# Patient Record
Sex: Male | Born: 1965 | Race: White | Hispanic: No | Marital: Single | Smoking: Current every day smoker
Health system: Southern US, Community
[De-identification: ages and names within clinical notes are randomized; demographics above are authoritative.]

## PROBLEM LIST (undated history)

## (undated) DIAGNOSIS — J449 Chronic obstructive pulmonary disease, unspecified: Secondary | ICD-10-CM

## (undated) DIAGNOSIS — Z720 Tobacco use: Secondary | ICD-10-CM

## (undated) DIAGNOSIS — M1990 Unspecified osteoarthritis, unspecified site: Secondary | ICD-10-CM

## (undated) DIAGNOSIS — K219 Gastro-esophageal reflux disease without esophagitis: Secondary | ICD-10-CM

## (undated) DIAGNOSIS — M109 Gout, unspecified: Secondary | ICD-10-CM

## (undated) DIAGNOSIS — K469 Unspecified abdominal hernia without obstruction or gangrene: Secondary | ICD-10-CM

## (undated) HISTORY — DX: Unspecified osteoarthritis, unspecified site: M19.90

## (undated) HISTORY — PX: PRESSURE ULCER DEBRIDEMENT: SHX750

## (undated) HISTORY — PX: SHOULDER SURGERY: SHX246

## (undated) HISTORY — PX: CARDIOVERSION: SHX1299

## (undated) HISTORY — PX: TOE SURGERY: SHX1073

## (undated) HISTORY — PX: HERNIA REPAIR: SHX51

---

## 2014-02-03 ENCOUNTER — Emergency Department (HOSPITAL_COMMUNITY): Payer: Self-pay

## 2014-02-03 ENCOUNTER — Emergency Department (HOSPITAL_COMMUNITY)
Admission: EM | Admit: 2014-02-03 | Discharge: 2014-02-03 | Disposition: A | Discharge status: Home / Self Care | Payer: Self-pay | Attending: Emergency Medicine | Admitting: Emergency Medicine

## 2014-02-03 ENCOUNTER — Encounter (HOSPITAL_COMMUNITY): Payer: Self-pay | Admitting: Adult Health

## 2014-02-03 DIAGNOSIS — S42155A Nondisplaced fracture of neck of scapula, left shoulder, initial encounter for closed fracture: Secondary | ICD-10-CM | POA: Insufficient documentation

## 2014-02-03 DIAGNOSIS — Y9389 Activity, other specified: Secondary | ICD-10-CM | POA: Insufficient documentation

## 2014-02-03 DIAGNOSIS — S3991XA Unspecified injury of abdomen, initial encounter: Secondary | ICD-10-CM | POA: Insufficient documentation

## 2014-02-03 DIAGNOSIS — Z23 Encounter for immunization: Secondary | ICD-10-CM | POA: Insufficient documentation

## 2014-02-03 DIAGNOSIS — S0990XA Unspecified injury of head, initial encounter: Secondary | ICD-10-CM | POA: Insufficient documentation

## 2014-02-03 DIAGNOSIS — Y998 Other external cause status: Secondary | ICD-10-CM | POA: Insufficient documentation

## 2014-02-03 DIAGNOSIS — S2232XA Fracture of one rib, left side, initial encounter for closed fracture: Secondary | ICD-10-CM | POA: Insufficient documentation

## 2014-02-03 DIAGNOSIS — T1490XA Injury, unspecified, initial encounter: Secondary | ICD-10-CM

## 2014-02-03 DIAGNOSIS — Z9889 Other specified postprocedural states: Secondary | ICD-10-CM | POA: Insufficient documentation

## 2014-02-03 DIAGNOSIS — W201XXA Struck by object due to collapse of building, initial encounter: Secondary | ICD-10-CM | POA: Insufficient documentation

## 2014-02-03 DIAGNOSIS — Y9289 Other specified places as the place of occurrence of the external cause: Secondary | ICD-10-CM | POA: Insufficient documentation

## 2014-02-03 DIAGNOSIS — W19XXXA Unspecified fall, initial encounter: Secondary | ICD-10-CM

## 2014-02-03 DIAGNOSIS — Z720 Tobacco use: Secondary | ICD-10-CM | POA: Insufficient documentation

## 2014-02-03 LAB — CBC WITH DIFFERENTIAL/PLATELET
Basophils Absolute: 0 10*3/uL (ref 0.0–0.1)
Basophils Relative: 0 % (ref 0–1)
EOS PCT: 2 % (ref 0–5)
Eosinophils Absolute: 0.2 10*3/uL (ref 0.0–0.7)
HEMATOCRIT: 43.1 % (ref 39.0–52.0)
Hemoglobin: 14.4 g/dL (ref 13.0–17.0)
LYMPHS ABS: 1.8 10*3/uL (ref 0.7–4.0)
Lymphocytes Relative: 14 % (ref 12–46)
MCH: 30.7 pg (ref 26.0–34.0)
MCHC: 33.4 g/dL (ref 30.0–36.0)
MCV: 91.9 fL (ref 78.0–100.0)
MONOS PCT: 7 % (ref 3–12)
Monocytes Absolute: 0.9 10*3/uL (ref 0.1–1.0)
Neutro Abs: 10.1 10*3/uL — ABNORMAL HIGH (ref 1.7–7.7)
Neutrophils Relative %: 77 % (ref 43–77)
Platelets: 349 10*3/uL (ref 150–400)
RBC: 4.69 MIL/uL (ref 4.22–5.81)
RDW: 13.5 % (ref 11.5–15.5)
WBC: 13 10*3/uL — ABNORMAL HIGH (ref 4.0–10.5)

## 2014-02-03 LAB — COMPREHENSIVE METABOLIC PANEL
ALT: 15 U/L (ref 0–53)
AST: 27 U/L (ref 0–37)
Albumin: 3.8 g/dL (ref 3.5–5.2)
Alkaline Phosphatase: 92 U/L (ref 39–117)
Anion gap: 16 — ABNORMAL HIGH (ref 5–15)
BUN: 16 mg/dL (ref 6–23)
CALCIUM: 9.3 mg/dL (ref 8.4–10.5)
CO2: 22 meq/L (ref 19–32)
Chloride: 104 mEq/L (ref 96–112)
Creatinine, Ser: 1.25 mg/dL (ref 0.50–1.35)
GFR, EST AFRICAN AMERICAN: 78 mL/min — AB (ref >90)
GFR, EST NON AFRICAN AMERICAN: 67 mL/min — AB (ref >90)
GLUCOSE: 96 mg/dL (ref 70–99)
Potassium: 4.8 mEq/L (ref 3.7–5.3)
Sodium: 142 mEq/L (ref 137–147)
Total Bilirubin: 0.3 mg/dL (ref 0.3–1.2)
Total Protein: 7 g/dL (ref 6.0–8.3)

## 2014-02-03 LAB — I-STAT CHEM 8, ED
BUN: 19 mg/dL (ref 6–23)
Calcium, Ion: 1.14 mmol/L (ref 1.12–1.23)
Chloride: 104 mEq/L (ref 96–112)
Creatinine, Ser: 1.4 mg/dL — ABNORMAL HIGH (ref 0.50–1.35)
GLUCOSE: 95 mg/dL (ref 70–99)
HEMATOCRIT: 46 % (ref 39.0–52.0)
HEMOGLOBIN: 15.6 g/dL (ref 13.0–17.0)
Potassium: 4.5 mEq/L (ref 3.7–5.3)
SODIUM: 141 meq/L (ref 137–147)
TCO2: 26 mmol/L (ref 0–100)

## 2014-02-03 MED ORDER — HYDROMORPHONE HCL 1 MG/ML IJ SOLN
1.0000 mg | Freq: Once | INTRAMUSCULAR | Status: AC
Start: 2014-02-03 — End: 2014-02-03
  Administered 2014-02-03: 1 mg via INTRAVENOUS
  Filled 2014-02-03: qty 1

## 2014-02-03 MED ORDER — SODIUM CHLORIDE 0.9 % IV BOLUS (SEPSIS)
1000.0000 mL | Freq: Once | INTRAVENOUS | Status: AC
Start: 2014-02-03 — End: 2014-02-03
  Administered 2014-02-03: 1000 mL via INTRAVENOUS

## 2014-02-03 MED ORDER — TETANUS-DIPHTH-ACELL PERTUSSIS 5-2.5-18.5 LF-MCG/0.5 IM SUSP
0.5000 mL | Freq: Once | INTRAMUSCULAR | Status: AC
  Administered 2014-02-03: 0.5 mL via INTRAMUSCULAR
  Filled 2014-02-03: qty 0.5

## 2014-02-03 MED ORDER — HYDROCODONE-ACETAMINOPHEN 5-325 MG PO TABS: 1.0000 | ORAL_TABLET | Freq: Four times a day (QID) | ORAL | Status: DC | PRN

## 2014-02-03 MED ORDER — IOHEXOL 300 MG/ML  SOLN
100.0000 mL | Freq: Once | INTRAMUSCULAR | Status: AC | PRN
  Administered 2014-02-03: 90 mL via INTRAVENOUS

## 2014-02-03 NOTE — ED Notes (Signed)
After discharge, pt requesting pain meds & was advised to fill prescription for Norco/Vicodin.

## 2014-02-03 NOTE — ED Notes (Signed)
PResents post fall from Scaffold approximately 10-15 feet high and then a garage fell on top pf patient, scrapes and redness to left plank and scrape to upper right back. Endorses neck pain, SOB and generalized body pain. Unknown tetanus GCS 15

## 2014-02-03 NOTE — Discharge Instructions (Signed)
Follow up with dr. Lajoyce Cornersduda next week.

## 2014-02-03 NOTE — ED Provider Notes (Signed)
CSN: [id number]     Arrival date & time 02/03/14  1553 History   First MD Initiated Contact with Patient 02/03/14 1608     Chief Complaint  Patient presents with  . Fall     (Consider location/radiation/quality/duration/timing/severity/associated sxs/prior Treatment) Patient is a 48 y.o. male presenting with fall. The history is provided by the patient (the pt fell about 15 feet and a garage fell on him.  pt has back pain).  Fall This is a new problem. The current episode started 1 to 2 hours ago. The problem occurs constantly. The problem has not changed since onset.Associated symptoms include abdominal pain and headaches. Pertinent negatives include no chest pain. Exacerbated by: movement. Nothing relieves the symptoms. He has tried nothing for the symptoms.    History reviewed. No pertinent past medical history. Past Surgical History  Procedure Laterality Date  . Hernia repair    . Shoulder surgery     History reviewed. No pertinent family history. History  Substance Use Topics  . Smoking status: Current Every Day Smoker    Types: Cigarettes  . Smokeless tobacco: Not on file  . Alcohol Use: No    Review of Systems  Constitutional: Negative for appetite change and fatigue.  HENT: Negative for congestion, ear discharge and sinus pressure.   Eyes: Negative for discharge.  Respiratory: Negative for cough.   Cardiovascular: Negative for chest pain.  Gastrointestinal: Positive for abdominal pain. Negative for diarrhea.  Genitourinary: Negative for frequency and hematuria.  Musculoskeletal: Positive for back pain.  Skin: Negative for rash.  Neurological: Positive for headaches. Negative for seizures.  Psychiatric/Behavioral: Negative for hallucinations.      Allergies  Review of patient's allergies indicates no known allergies.  Home Medications   Prior to Admission medications   Medication Sig Start Date End Date Taking? Authorizing Provider   HYDROcodone-acetaminophen (NORCO/VICODIN) 5-325 MG per tablet Take 1 tablet by mouth every 6 (six) hours as needed for moderate pain. 02/03/14   Benny Lennert, MD   BP 131/80 mmHg  Pulse 91  Temp(Src) 98.8 F (37.1 C) (Oral)  Resp 24  Ht 6' 4.5" (1.943 m)  Wt 185 lb (83.915 kg)  BMI 22.23 kg/m2  SpO2 98% Physical Exam  Constitutional: He is oriented to person, place, and time. He appears well-developed.  HENT:  Head: Normocephalic.  Eyes: Conjunctivae and EOM are normal. No scleral icterus.  Neck: Neck supple. No thyromegaly present.  Cardiovascular: Normal rate and regular rhythm.  Exam reveals no gallop and no friction rub.   No murmur heard. Pulmonary/Chest: No stridor. He has no wheezes. He has no rales. He exhibits tenderness.  Abdominal: He exhibits no distension. There is no tenderness. There is no rebound.  Musculoskeletal: Normal range of motion. He exhibits no edema.  Tender left scapula and lower back  Lymphadenopathy:    He has no cervical adenopathy.  Neurological: He is oriented to person, place, and time. He exhibits normal muscle tone. Coordination normal.  Skin: No rash noted. No erythema.  Psychiatric: He has a normal mood and affect. His behavior is normal.    ED Course  Procedures (including critical care time) Labs Review Labs Reviewed  CBC WITH DIFFERENTIAL - Abnormal; Notable for the following:    WBC 13.0 (*)    Neutro Abs 10.1 (*)    All other components within normal limits  COMPREHENSIVE METABOLIC PANEL - Abnormal; Notable for the following:    GFR calc non Af Amer 67 (*)  GFR calc Af Amer 78 (*)    Anion gap 16 (*)    All other components within normal limits  I-STAT CHEM 8, ED - Abnormal; Notable for the following:    Creatinine, Ser 1.40 (*)    All other components within normal limits    Imaging Review Ct Head Wo Contrast  02/03/2014   CLINICAL DATA:  Larey SeatFell from a scaffold approximately 10-15 feet. Material then fell on top of the  patient. Neck pain. Headache.  EXAM: CT HEAD WITHOUT CONTRAST  CT CERVICAL SPINE WITHOUT CONTRAST  TECHNIQUE: Multidetector CT imaging of the head and cervical spine was performed following the standard protocol without intravenous contrast. Multiplanar CT image reconstructions of the cervical spine were also generated.  COMPARISON:  None.  FINDINGS: CT HEAD FINDINGS  The brain has a normal appearance without evidence of atrophy, infarction, mass lesion, hemorrhage, hydrocephalus or extra-axial collection. The calvarium is unremarkable. The paranasal sinuses, middle ears and mastoids are clear.  CT CERVICAL SPINE FINDINGS  Alignment is normal. 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 C2-3 is normal. C3-4 shows facet degeneration left more than right but no significant stenosis. C4-5 shows facet degeneration and and uncovertebral degeneration bilaterally with moderate foraminal narrowing. C5-6 shows facet degeneration and spondylosis left worse than right with left foraminal stenosis. 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 C7-T1 shows facet arthropathy left worse than right with some foraminal narrowing on the left. T1-2 shows congenital failure of separation. Lung apices show emphysema.  IMPRESSION: Head CT:  Normal.  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 Degenerative changes of the neck as outlined above. No acute or traumatic finding.   Electronically Signed   By: Paulina FusiMark  Shogry M.D.   On: 02/03/2014 18:19   Ct Chest W Contrast  02/03/2014   CLINICAL DATA:  Post fall from scaffold about 10-15 feet high right upper back pain  EXAM: CT CHEST, ABDOMEN, AND PELVIS WITH CONTRAST  TECHNIQUE: Multidetector CT imaging of the chest, abdomen and pelvis was performed following the standard protocol during bolus administration of intravenous contrast.  CONTRAST:  90mL OMNIPAQUE IOHEXOL 300 MG/ML  SOLN  COMPARISON:  None.  FINDINGS: CT CHEST FINDINGS  Images of the thoracic  inlet are unremarkable. Sagittal images of the spine shows degenerative changes mid thoracic spine. Sagittal view of the sternum is unremarkable.  There are emphysematous changes with paraseptal and centrilobular emphysematous bullae bilateral upper lobes. Axial image 8 there is nondisplaced fracture of posterior medial aspect of left scapula. Axial image 29 there is nondisplaced fracture of posterior aspect left seventh rib. Small amount of adjacent pleural thickening and probable lung contusion. There is also old fracture of the lateral aspect of the left seventh rib. Small amount of atelectasis or infiltrate lung bases posteriorly. There is no diagnostic pneumothorax.  Heart size within normal limits. Thoracic aorta is unremarkable. No mediastinal hematoma or adenopathy. Central pulmonary artery is unremarkable. No pleural effusion.  CT ABDOMEN AND PELVIS FINDINGS  Enhanced liver shows no evidence of laceration. No lower rib fractures are identified. Sagittal images of the lumbar spine shows no acute fractures. Mild degenerative changes lumbar spine.  No sacral fracture is noted. Mild degenerative changes bilateral SI joints. No pelvic fractures are noted. The pancreas, spleen and adrenal glands are unremarkable. No evidence of splenic laceration. Kidneys are symmetrical in size and enhancement. No hydronephrosis or hydroureter. Abdominal aorta is unremarkable.  No thickened or dilated small bowel loops are noted.  Normal appendix seen in axial image 108. No pericecal inflammation. Moderate colonic stool. No pelvic ascites or adenopathy. There is a tiny ventral hernia in left inguinal region containing fat measures about 1.9 cm. No evidence of acute complication. No evidence of urinary bladder injury. Prostate gland and seminal vesicles are unremarkable.  Coronal images shows no hip fractures.  IMPRESSION: 1. There is nondisplaced fracture of posteromedial aspect of the left scapula. 2. Nondisplaced fracture of  the left posterior seventh rib with adjacent small pleural thickening and probable tiny lung contusion. Small atelectasis or infiltrate lung bases posteriorly. 3. Emphysematous changes bilateral upper lobes. No pneumothorax. No segmental infiltrate or pulmonary edema. 4. No mediastinal hematoma or adenopathy. Thoracic and abdominal aorta are unremarkable. 5. No acute visceral injury within abdomen or pelvis. 6. No hydronephrosis or hydroureter. 7. No evidence of urinary bladder injury. 8. No acute fractures are noted within abdomen or pelvis.   Electronically Signed   By: Natasha Mead M.D.   On: 02/03/2014 18:32   Ct Cervical Spine Wo Contrast  02/03/2014   CLINICAL DATA:  Larey Seat from a scaffold approximately 10-15 feet. Material then fell on top of the patient. Neck pain. Headache.  EXAM: CT HEAD WITHOUT CONTRAST  CT CERVICAL SPINE WITHOUT CONTRAST  TECHNIQUE: Multidetector CT imaging of the head and cervical spine was performed following the standard protocol without intravenous contrast. Multiplanar CT image reconstructions of the cervical spine were also generated.  COMPARISON:  None.  FINDINGS: CT HEAD FINDINGS  The brain has a normal appearance without evidence of atrophy, infarction, mass lesion, hemorrhage, hydrocephalus or extra-axial collection. The calvarium is unremarkable. The paranasal sinuses, middle ears and mastoids are clear.  CT CERVICAL SPINE FINDINGS  Alignment is normal. 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 C2-3 is normal. C3-4 shows facet degeneration left more than right but no significant stenosis. C4-5 shows facet degeneration and and uncovertebral degeneration bilaterally with moderate foraminal narrowing. C5-6 shows facet degeneration and spondylosis left worse than right with left foraminal stenosis. 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 C7-T1 shows facet arthropathy left worse than right with some foraminal narrowing on the left. T1-2 shows congenital failure  of separation. Lung apices show emphysema.  IMPRESSION: Head CT:  Normal.  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 Degenerative changes of the neck as outlined above. No acute or traumatic finding.   Electronically Signed   By: Paulina Fusi M.D.   On: 02/03/2014 18:19   Ct Abdomen Pelvis W Contrast  02/03/2014   CLINICAL DATA:  Post fall from scaffold about 10-15 feet high right upper back pain  EXAM: CT CHEST, ABDOMEN, AND PELVIS WITH CONTRAST  TECHNIQUE: Multidetector CT imaging of the chest, abdomen and pelvis was performed following the standard protocol during bolus administration of intravenous contrast.  CONTRAST:  90mL OMNIPAQUE IOHEXOL 300 MG/ML  SOLN  COMPARISON:  None.  FINDINGS: CT CHEST FINDINGS  Images of the thoracic inlet are unremarkable. Sagittal images of the spine shows degenerative changes mid thoracic spine. Sagittal view of the sternum is unremarkable.  There are emphysematous changes with paraseptal and centrilobular emphysematous bullae bilateral upper lobes. Axial image 8 there is nondisplaced fracture of posterior medial aspect of left scapula. Axial image 29 there is nondisplaced fracture of posterior aspect left seventh rib. Small amount of adjacent pleural thickening and probable lung contusion. There is also old fracture of the lateral aspect of the left seventh rib. Small amount of atelectasis or  infiltrate lung bases posteriorly. There is no diagnostic pneumothorax.  Heart size within normal limits. Thoracic aorta is unremarkable. No mediastinal hematoma or adenopathy. Central pulmonary artery is unremarkable. No pleural effusion.  CT ABDOMEN AND PELVIS FINDINGS  Enhanced liver shows no evidence of laceration. No lower rib fractures are identified. Sagittal images of the lumbar spine shows no acute fractures. Mild degenerative changes lumbar spine.  No sacral fracture is noted. Mild degenerative changes bilateral SI joints. No pelvic fractures are noted.  The pancreas, spleen and adrenal glands are unremarkable. No evidence of splenic laceration. Kidneys are symmetrical in size and enhancement. No hydronephrosis or hydroureter. Abdominal aorta is unremarkable.  No thickened or dilated small bowel loops are noted. Normal appendix seen in axial image 108. No pericecal inflammation. Moderate colonic stool. No pelvic ascites or adenopathy. There is a tiny ventral hernia in left inguinal region containing fat measures about 1.9 cm. No evidence of acute complication. No evidence of urinary bladder injury. Prostate gland and seminal vesicles are unremarkable.  Coronal images shows no hip fractures.  IMPRESSION: 1. There is nondisplaced fracture of posteromedial aspect of the left scapula. 2. Nondisplaced fracture of the left posterior seventh rib with adjacent small pleural thickening and probable tiny lung contusion. Small atelectasis or infiltrate lung bases posteriorly. 3. Emphysematous changes bilateral upper lobes. No pneumothorax. No segmental infiltrate or pulmonary edema. 4. No mediastinal hematoma or adenopathy. Thoracic and abdominal aorta are unremarkable. 5. No acute visceral injury within abdomen or pelvis. 6. No hydronephrosis or hydroureter. 7. No evidence of urinary bladder injury. 8. No acute fractures are noted within abdomen or pelvis.   Electronically Signed   By: Natasha MeadLiviu  Pop M.D.   On: 02/03/2014 18:32   Dg Chest Port 1 View  02/03/2014   CLINICAL DATA:  Pt states 2 walls of a building fell on him, pains bilat ribs, hx old fx from years aho, left shoulder pains, posterior upper scapular abrasion, but has full range of motion, smoker 1ppd, hx copd, htn  EXAM: PORTABLE CHEST - 1 VIEW  COMPARISON:  None  FINDINGS: Cardiac silhouette is normal in size. Normal mediastinal and hilar contours.  Lungs are hyperexpanded. There are reticular opacities in the lung bases, likely scarring and/ or atelectasis. Minor scarring is noted in the apices. No lung  consolidation or edema. No pleural effusion or pneumothorax.  Old healed left-sided rib fractures.  No acute fractures seen.  IMPRESSION: No acute cardiopulmonary disease.   Electronically Signed   By: Amie Portlandavid  Ormond M.D.   On: 02/03/2014 16:54     EKG Interpretation   Date/Time:  Tuesday February 03 2014 16:32:56 EST Ventricular Rate:  86 PR Interval:  134 QRS Duration: 101 QT Interval:  369 QTC Calculation: 441 R Axis:   -131 Text Interpretation:  Right and left arm electrode reversal,  interpretation assumes no reversal Sinus rhythm Right axis deviation  Abnormal T, consider ischemia, lateral leads ST elev, probable normal  early repol pattern Confirmed by Christphor Groft  MD, Iker Nuttall [phone]([id number]) on 02/03/2014  7:00:05 PM      MDM   Final diagnoses:  Trauma  Fall  Closed rib fracture, left, initial encounter    Pt will follow up with ortho for scapula and rib fx.   tx with vicodin   Benny LennertJoseph L Wilmore Holsomback, MD 02/03/14 1900

## 2015-09-26 ENCOUNTER — Inpatient Hospital Stay (HOSPITAL_COMMUNITY)
Admission: EM | Admit: 2015-09-26 | Discharge: 2015-10-03 | DRG: 854 | Disposition: A | Discharge status: Home / Self Care | Payer: Commercial Managed Care - PPO | Attending: Internal Medicine | Admitting: Internal Medicine

## 2015-09-26 ENCOUNTER — Encounter (HOSPITAL_COMMUNITY): Payer: Self-pay | Admitting: Emergency Medicine

## 2015-09-26 DIAGNOSIS — L03115 Cellulitis of right lower limb: Secondary | ICD-10-CM | POA: Diagnosis present

## 2015-09-26 DIAGNOSIS — L02415 Cutaneous abscess of right lower limb: Secondary | ICD-10-CM | POA: Diagnosis present

## 2015-09-26 DIAGNOSIS — Z720 Tobacco use: Secondary | ICD-10-CM | POA: Diagnosis present

## 2015-09-26 DIAGNOSIS — K5900 Constipation, unspecified: Secondary | ICD-10-CM | POA: Diagnosis not present

## 2015-09-26 DIAGNOSIS — Z23 Encounter for immunization: Secondary | ICD-10-CM

## 2015-09-26 DIAGNOSIS — M109 Gout, unspecified: Secondary | ICD-10-CM | POA: Diagnosis present

## 2015-09-26 DIAGNOSIS — J449 Chronic obstructive pulmonary disease, unspecified: Secondary | ICD-10-CM | POA: Diagnosis present

## 2015-09-26 DIAGNOSIS — F17210 Nicotine dependence, cigarettes, uncomplicated: Secondary | ICD-10-CM | POA: Diagnosis present

## 2015-09-26 DIAGNOSIS — A419 Sepsis, unspecified organism: Secondary | ICD-10-CM | POA: Diagnosis present

## 2015-09-26 DIAGNOSIS — B999 Unspecified infectious disease: Secondary | ICD-10-CM

## 2015-09-26 DIAGNOSIS — M25571 Pain in right ankle and joints of right foot: Secondary | ICD-10-CM | POA: Diagnosis not present

## 2015-09-26 HISTORY — DX: Unspecified abdominal hernia without obstruction or gangrene: K46.9

## 2015-09-26 HISTORY — DX: Gout, unspecified: M10.9

## 2015-09-26 HISTORY — DX: Tobacco use: Z72.0

## 2015-09-26 HISTORY — DX: Chronic obstructive pulmonary disease, unspecified: J44.9

## 2015-09-26 NOTE — ED Notes (Signed)
Pt states on Thursday he stepped off the back of a truck and felt a pop in the top of his right foot   Pt states since he has had pain  Pt states today the foot and ankle area are red and swollen  Pt has redness and swelling from his foot up to above his ankle  Pt states it is a burning pain that is worse when he puts pressure on it  Pt states he has had fever and chills as well  Pt last took ibuprofen at 1100 this afternoon

## 2015-09-26 NOTE — ED Provider Notes (Signed)
CSN: [id number]     Arrival date & time 09/26/15  2314 History   First MD Initiated Contact with Patient 09/26/15 2340     Chief Complaint  Patient presents with  . Foot Injury     (Consider location/radiation/quality/duration/timing/severity/associated sxs/prior Treatment) HPI Comments: Patient with past medical history remarkable for gout presents to the emergency department with chief complaint of right ankle pain and swelling anteriorly. He reports redness that has been spreading from his ankle to his shin. He states that he stepped off of a truck on Thursday, and felt a pop in his foot. He reports having pain since then. He states that the redness has been swelling. He reports associated fevers and chills. Denies any nausea or vomiting. He has tried using ice and ibuprofen with no relief. His symptoms are worsened with palpation.  The history is provided by the patient. No language interpreter was used.    Past Medical History  Diagnosis Date  . Hernia, abdominal   . COPD (chronic obstructive pulmonary disease) (HCC)   . Gout    Past Surgical History  Procedure Laterality Date  . Hernia repair    . Shoulder surgery     Family History  Problem Relation Age of Onset  . Cancer Mother   . Parkinson's disease Mother   . Emphysema Father    Social History  Substance Use Topics  . Smoking status: Current Every Day Smoker    Types: Cigarettes  . Smokeless tobacco: None  . Alcohol Use: No    Review of Systems  Skin: Positive for color change.  All other systems reviewed and are negative.     Allergies  Review of patient's allergies indicates no known allergies.  Home Medications   Prior to Admission medications   Medication Sig Start Date End Date Taking? Authorizing Provider  ibuprofen (ADVIL,MOTRIN) 200 MG tablet Take 600 mg by mouth every 6 (six) hours as needed (FOR PAIN.).   Yes Historical Provider, MD  Multiple Vitamin (MULTIVITAMIN WITH MINERALS) TABS tablet  Take 1 tablet by mouth daily. FOR MEN.   Yes Historical Provider, MD   BP 112/80 mmHg  Pulse 104  Temp(Src) 99.6 F (37.6 C) (Oral)  Resp 20  Ht 6' 4.5" (1.943 m)  Wt 83.915 kg  BMI 22.23 kg/m2  SpO2 98% Physical Exam  Constitutional: He is oriented to person, place, and time. He appears well-developed and well-nourished.  HENT:  Head: Normocephalic and atraumatic.  Eyes: Conjunctivae and EOM are normal. Pupils are equal, round, and reactive to light. Right eye exhibits no discharge. Left eye exhibits no discharge. No scleral icterus.  Neck: Normal range of motion. Neck supple. No JVD present.  Cardiovascular: Regular rhythm, normal heart sounds and intact distal pulses.  Exam reveals no gallop and no friction rub.   No murmur heard. Mild tachycardic  Pulmonary/Chest: Effort normal and breath sounds normal. No respiratory distress. He has no wheezes. He has no rales. He exhibits no tenderness.  Abdominal: Soft. He exhibits no distension and no mass. There is no tenderness. There is no rebound and no guarding.  Musculoskeletal: Normal range of motion. He exhibits no edema or tenderness.  ROM and strength of right ankle is 5/5, but painful, doubt septic joint Mild swelling of ankle  Neurological: He is alert and oriented to person, place, and time.  Sensation intact  Skin: Skin is warm and dry.  Erythema consistent with cellulitis, no obvious abscess  Psychiatric: He has a normal mood and  affect. His behavior is normal. Judgment and thought content normal.  Nursing note and vitals reviewed.   ED Course  Procedures (including critical care time) Results for orders placed or performed during the hospital encounter of 09/26/15  CBC with Differential/Platelet  Result Value Ref Range   WBC 17.2 (H) 4.0 - 10.5 K/uL   RBC 4.53 4.22 - 5.81 MIL/uL   Hemoglobin 14.2 13.0 - 17.0 g/dL   HCT 96.0 45.4 - 09.8 %   MCV 88.5 78.0 - 100.0 fL   MCH 31.3 26.0 - 34.0 pg   MCHC 35.4 30.0 - 36.0  g/dL   RDW 11.9 14.7 - 82.9 %   Platelets 296 150 - 400 K/uL   Neutrophils Relative % 77 %   Neutro Abs 13.2 (H) 1.7 - 7.7 K/uL   Lymphocytes Relative 12 %   Lymphs Abs 2.0 0.7 - 4.0 K/uL   Monocytes Relative 11 %   Monocytes Absolute 1.9 (H) 0.1 - 1.0 K/uL   Eosinophils Relative 0 %   Eosinophils Absolute 0.1 0.0 - 0.7 K/uL   Basophils Relative 0 %   Basophils Absolute 0.0 0.0 - 0.1 K/uL  I-stat chem 8, ed  Result Value Ref Range   Sodium 138 135 - 145 mmol/L   Potassium 3.8 3.5 - 5.1 mmol/L   Chloride 100 (L) 101 - 111 mmol/L   BUN 14 6 - 20 mg/dL   Creatinine, Ser 5.62 0.61 - 1.24 mg/dL   Glucose, Bld 130 (H) 65 - 99 mg/dL   Calcium, Ion 8.65 7.84 - 1.30 mmol/L   TCO2 25 0 - 100 mmol/L   Hemoglobin 15.0 13.0 - 17.0 g/dL   HCT 69.6 29.5 - 28.4 %   Dg Ankle Complete Right  09/27/2015  CLINICAL DATA:  Right foot and ankle pain. Stepped off back of a truck 3 days prior, felt a pop. Pain since that time. Redness and swelling today. EXAM: RIGHT ANKLE - COMPLETE 3+ VIEW COMPARISON:  None. FINDINGS: No fracture or dislocation. The alignment and joint spaces are maintained. Ankle mortise is preserved. No bony destructive change or periosteal reaction. There is soft tissue edema noted. No evidence joint effusion. IMPRESSION: Soft tissue edema.  No acute osseous abnormality. Electronically Signed   By: Rubye Oaks M.D.   On: 09/27/2015 00:38   Dg Foot Complete Right  09/27/2015  CLINICAL DATA:  Right foot and ankle pain. Stepped off back of a truck 3 days prior, felt a pop. Pain since that time. Redness and swelling today. EXAM: RIGHT FOOT COMPLETE - 3+ VIEW COMPARISON:  None. FINDINGS: No acute fracture or subluxation. Degenerative change at the first metatarsal phalangeal joint with joint space narrowing and subchondral change. There is a prominent os navicular. No bony destructive change or periosteal reaction. Questionable pes planus, these are nonweightbearing views. Dorsal soft tissue  edema. IMPRESSION: Dorsal soft tissue edema.  No acute osseous abnormalities. 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 Electronically Signed   By: Rubye Oaks M.D.   On: 09/27/2015 00:40    I have personally reviewed and evaluated these images and lab results as part of my medical decision-making.    MDM   Final diagnoses:  Cellulitis of right lower extremity    Patient with cellulitis of the right anterior ankle.  Doubt septic joint, normal ROM and strength.  No evidence of abscess on [place].  Patient is not diabetic or immune compromised.    Patient seen by and discussed with  Dr. Lynelle DoctorKnapp, who agrees with outpatient trial unless concerning lab findings.  Recommends a dose of IV antibiotics here now.  Patient understands and agrees with the plan.  12:37 AM WBC is 17.2 with left shift.  Discussed with Dr. Lynelle DoctorKnapp, who recommends observation in the hospital.  12:51 AM Appreciate Dr. Clyde LundborgNiu for observing the patient in the hospital overnight.    Roxy Horsemanobert Dhriti Fales, PA-C 09/27/15 [id number]  Linwood DibblesJon Knapp, MD 09/28/15 [phone]

## 2015-09-27 ENCOUNTER — Encounter (HOSPITAL_COMMUNITY): Payer: Self-pay | Admitting: Internal Medicine

## 2015-09-27 ENCOUNTER — Observation Stay (HOSPITAL_COMMUNITY): Payer: Commercial Managed Care - PPO

## 2015-09-27 ENCOUNTER — Emergency Department (HOSPITAL_COMMUNITY): Payer: Commercial Managed Care - PPO

## 2015-09-27 DIAGNOSIS — L03115 Cellulitis of right lower limb: Secondary | ICD-10-CM | POA: Diagnosis present

## 2015-09-27 DIAGNOSIS — J449 Chronic obstructive pulmonary disease, unspecified: Secondary | ICD-10-CM | POA: Diagnosis present

## 2015-09-27 DIAGNOSIS — A419 Sepsis, unspecified organism: Principal | ICD-10-CM

## 2015-09-27 DIAGNOSIS — Z720 Tobacco use: Secondary | ICD-10-CM

## 2015-09-27 HISTORY — DX: Sepsis, unspecified organism: A41.9

## 2015-09-27 HISTORY — DX: Cellulitis of right lower limb: L03.115

## 2015-09-27 LAB — BASIC METABOLIC PANEL
Anion gap: 6 (ref 5–15)
BUN: 12 mg/dL (ref 6–20)
CALCIUM: 7.8 mg/dL — AB (ref 8.9–10.3)
CHLORIDE: 107 mmol/L (ref 101–111)
CO2: 24 mmol/L (ref 22–32)
CREATININE: 0.9 mg/dL (ref 0.61–1.24)
GFR calc Af Amer: >60 mL/min (ref >60)
GFR calc non Af Amer: >60 mL/min (ref >60)
Glucose, Bld: 150 mg/dL — ABNORMAL HIGH (ref 65–99)
Potassium: 3.2 mmol/L — ABNORMAL LOW (ref 3.5–5.1)
Sodium: 137 mmol/L (ref 135–145)

## 2015-09-27 LAB — SEDIMENTATION RATE: Sed Rate: 24 mm/hr — ABNORMAL HIGH (ref 0–16)

## 2015-09-27 LAB — I-STAT CHEM 8, ED
BUN: 14 mg/dL (ref 6–20)
CALCIUM ION: 1.13 mmol/L (ref 1.13–1.30)
Chloride: 100 mmol/L — ABNORMAL LOW (ref 101–111)
Creatinine, Ser: 1 mg/dL (ref 0.61–1.24)
GLUCOSE: 105 mg/dL — AB (ref 65–99)
HCT: 44 % (ref 39.0–52.0)
Hemoglobin: 15 g/dL (ref 13.0–17.0)
Potassium: 3.8 mmol/L (ref 3.5–5.1)
Sodium: 138 mmol/L (ref 135–145)
TCO2: 25 mmol/L (ref 0–100)

## 2015-09-27 LAB — C-REACTIVE PROTEIN: CRP: 14.3 mg/dL — ABNORMAL HIGH (ref <1.0)

## 2015-09-27 LAB — CBC WITH DIFFERENTIAL/PLATELET
Basophils Absolute: 0 10*3/uL (ref 0.0–0.1)
Basophils Relative: 0 %
EOS PCT: 0 %
Eosinophils Absolute: 0.1 10*3/uL (ref 0.0–0.7)
HCT: 40.1 % (ref 39.0–52.0)
Hemoglobin: 14.2 g/dL (ref 13.0–17.0)
LYMPHS ABS: 2 10*3/uL (ref 0.7–4.0)
LYMPHS PCT: 12 %
MCH: 31.3 pg (ref 26.0–34.0)
MCHC: 35.4 g/dL (ref 30.0–36.0)
MCV: 88.5 fL (ref 78.0–100.0)
MONOS PCT: 11 %
Monocytes Absolute: 1.9 10*3/uL — ABNORMAL HIGH (ref 0.1–1.0)
Neutro Abs: 13.2 10*3/uL — ABNORMAL HIGH (ref 1.7–7.7)
Neutrophils Relative %: 77 %
PLATELETS: 296 10*3/uL (ref 150–400)
RBC: 4.53 MIL/uL (ref 4.22–5.81)
RDW: 13.1 % (ref 11.5–15.5)
WBC: 17.2 10*3/uL — ABNORMAL HIGH (ref 4.0–10.5)

## 2015-09-27 LAB — CBC
HCT: 35.5 % — ABNORMAL LOW (ref 39.0–52.0)
Hemoglobin: 12 g/dL — ABNORMAL LOW (ref 13.0–17.0)
MCH: 30.8 pg (ref 26.0–34.0)
MCHC: 33.8 g/dL (ref 30.0–36.0)
MCV: 91 fL (ref 78.0–100.0)
PLATELETS: 258 10*3/uL (ref 150–400)
RBC: 3.9 MIL/uL — ABNORMAL LOW (ref 4.22–5.81)
RDW: 13.5 % (ref 11.5–15.5)
WBC: 13.7 10*3/uL — ABNORMAL HIGH (ref 4.0–10.5)

## 2015-09-27 LAB — PROTIME-INR
INR: 1.1 (ref 0.00–1.49)
Prothrombin Time: 14 seconds (ref 11.6–15.2)

## 2015-09-27 LAB — APTT: aPTT: 28 seconds (ref 24–37)

## 2015-09-27 LAB — HIV ANTIBODY (ROUTINE TESTING W REFLEX): HIV SCREEN 4TH GENERATION: NONREACTIVE

## 2015-09-27 LAB — PROCALCITONIN

## 2015-09-27 LAB — LACTIC ACID, PLASMA
Lactic Acid, Venous: 0.9 mmol/L (ref 0.5–1.9)
Lactic Acid, Venous: 1.3 mmol/L (ref 0.5–1.9)

## 2015-09-27 MED ORDER — VANCOMYCIN HCL IN DEXTROSE 1-5 GM/200ML-% IV SOLN: 1000.0000 mg | Freq: Once | INTRAVENOUS | Status: DC

## 2015-09-27 MED ORDER — DOXYCYCLINE HYCLATE 100 MG PO CAPS: 100.0000 mg | ORAL_CAPSULE | Freq: Two times a day (BID) | ORAL | Status: DC

## 2015-09-27 MED ORDER — SODIUM CHLORIDE 0.9% FLUSH
3.0000 mL | Freq: Two times a day (BID) | INTRAVENOUS | Status: DC
  Administered 2015-09-28 – 2015-10-03 (×5): 3 mL via INTRAVENOUS

## 2015-09-27 MED ORDER — ACETAMINOPHEN 650 MG RE SUPP: 650.0000 mg | Freq: Four times a day (QID) | RECTAL | Status: DC | PRN

## 2015-09-27 MED ORDER — OXYCODONE-ACETAMINOPHEN 5-325 MG PO TABS
2.0000 | ORAL_TABLET | Freq: Four times a day (QID) | ORAL | Status: DC | PRN
  Administered 2015-09-27 – 2015-10-01 (×16): 2 via ORAL
  Filled 2015-09-27 (×17): qty 2

## 2015-09-27 MED ORDER — NICOTINE 21 MG/24HR TD PT24
21.0000 mg | MEDICATED_PATCH | Freq: Every day | TRANSDERMAL | Status: DC
  Administered 2015-09-27 – 2015-10-03 (×7): 21 mg via TRANSDERMAL
  Filled 2015-09-27 (×7): qty 1

## 2015-09-27 MED ORDER — SODIUM CHLORIDE 0.9 % IV SOLN
INTRAVENOUS | Status: AC
  Administered 2015-09-27: 19:00:00 via INTRAVENOUS

## 2015-09-27 MED ORDER — POTASSIUM CHLORIDE CRYS ER 20 MEQ PO TBCR
40.0000 meq | EXTENDED_RELEASE_TABLET | Freq: Four times a day (QID) | ORAL | Status: AC
  Administered 2015-09-27 (×2): 40 meq via ORAL
  Filled 2015-09-27 (×2): qty 2

## 2015-09-27 MED ORDER — ONDANSETRON HCL 4 MG/2ML IJ SOLN: 4.0000 mg | Freq: Three times a day (TID) | INTRAMUSCULAR | Status: DC | PRN

## 2015-09-27 MED ORDER — PNEUMOCOCCAL VAC POLYVALENT 25 MCG/0.5ML IJ INJ
0.5000 mL | INJECTION | INTRAMUSCULAR | Status: AC
  Administered 2015-09-28: 0.5 mL via INTRAMUSCULAR
  Filled 2015-09-27 (×2): qty 0.5

## 2015-09-27 MED ORDER — VANCOMYCIN HCL IN DEXTROSE 1-5 GM/200ML-% IV SOLN
1000.0000 mg | Freq: Two times a day (BID) | INTRAVENOUS | Status: DC
  Administered 2015-09-27 – 2015-09-30 (×7): 1000 mg via INTRAVENOUS
  Filled 2015-09-27 (×7): qty 200

## 2015-09-27 MED ORDER — PIPERACILLIN-TAZOBACTAM 3.375 G IVPB 30 MIN
3.3750 g | Freq: Once | INTRAVENOUS | Status: AC
  Administered 2015-09-27: 3.375 g via INTRAVENOUS
  Filled 2015-09-27: qty 50

## 2015-09-27 MED ORDER — LORAZEPAM 1 MG PO TABS
1.0000 mg | ORAL_TABLET | Freq: Once | ORAL | Status: AC
  Administered 2015-09-27: 1 mg via ORAL
  Filled 2015-09-27: qty 1

## 2015-09-27 MED ORDER — SODIUM CHLORIDE 0.9 % IV SOLN
INTRAVENOUS | Status: DC
  Administered 2015-09-27: 04:00:00 via INTRAVENOUS

## 2015-09-27 MED ORDER — ACETAMINOPHEN 325 MG PO TABS
650.0000 mg | ORAL_TABLET | Freq: Four times a day (QID) | ORAL | Status: DC | PRN
  Filled 2015-09-27: qty 2

## 2015-09-27 MED ORDER — MAGNESIUM SULFATE IN D5W 1-5 GM/100ML-% IV SOLN
1.0000 g | Freq: Once | INTRAVENOUS | Status: AC
  Administered 2015-09-27: 1 g via INTRAVENOUS
  Filled 2015-09-27: qty 100

## 2015-09-27 MED ORDER — IBUPROFEN 200 MG PO TABS
600.0000 mg | ORAL_TABLET | Freq: Four times a day (QID) | ORAL | Status: DC | PRN
  Administered 2015-09-29 – 2015-10-01 (×3): 600 mg via ORAL
  Filled 2015-09-27 (×4): qty 3

## 2015-09-27 MED ORDER — MORPHINE SULFATE (PF) 4 MG/ML IV SOLN
4.0000 mg | Freq: Once | INTRAVENOUS | Status: AC
  Administered 2015-09-27: 4 mg via INTRAVENOUS
  Filled 2015-09-27: qty 1

## 2015-09-27 MED ORDER — VANCOMYCIN HCL IN DEXTROSE 1-5 GM/200ML-% IV SOLN
1000.0000 mg | Freq: Once | INTRAVENOUS | Status: AC
  Administered 2015-09-27: 1000 mg via INTRAVENOUS
  Filled 2015-09-27: qty 200

## 2015-09-27 MED ORDER — SODIUM CHLORIDE 0.9 % IV BOLUS (SEPSIS)
2500.0000 mL | Freq: Once | INTRAVENOUS | Status: AC
  Administered 2015-09-27: 2500 mL via INTRAVENOUS

## 2015-09-27 MED ORDER — ENOXAPARIN SODIUM 40 MG/0.4ML ~~LOC~~ SOLN
40.0000 mg | SUBCUTANEOUS | Status: DC
  Administered 2015-09-27 – 2015-09-29 (×3): 40 mg via SUBCUTANEOUS
  Filled 2015-09-27 (×3): qty 0.4

## 2015-09-27 MED ORDER — ADULT MULTIVITAMIN W/MINERALS CH
1.0000 | ORAL_TABLET | Freq: Every day | ORAL | Status: DC
  Administered 2015-09-27 – 2015-10-03 (×7): 1 via ORAL
  Filled 2015-09-27 (×7): qty 1

## 2015-09-27 MED ORDER — IPRATROPIUM-ALBUTEROL 0.5-2.5 (3) MG/3ML IN SOLN
3.0000 mL | Freq: Four times a day (QID) | RESPIRATORY_TRACT | Status: DC | PRN
  Administered 2015-09-27: 3 mL via RESPIRATORY_TRACT
  Filled 2015-09-27: qty 3

## 2015-09-27 MED ORDER — HYDROCODONE-ACETAMINOPHEN 5-325 MG PO TABS: 1.0000 | ORAL_TABLET | Freq: Four times a day (QID) | ORAL | Status: DC | PRN

## 2015-09-27 MED ORDER — DM-GUAIFENESIN ER 30-600 MG PO TB12
1.0000 | ORAL_TABLET | Freq: Two times a day (BID) | ORAL | Status: DC
  Administered 2015-09-27 – 2015-10-01 (×9): 1 via ORAL
  Filled 2015-09-27 (×13): qty 1

## 2015-09-27 MED ORDER — OXYCODONE-ACETAMINOPHEN 5-325 MG PO TABS
1.0000 | ORAL_TABLET | Freq: Once | ORAL | Status: AC
  Administered 2015-09-27: 1 via ORAL
  Filled 2015-09-27: qty 1

## 2015-09-27 MED ORDER — PIPERACILLIN-TAZOBACTAM 3.375 G IVPB
3.3750 g | Freq: Three times a day (TID) | INTRAVENOUS | Status: DC
  Administered 2015-09-27 – 2015-09-30 (×10): 3.375 g via INTRAVENOUS
  Filled 2015-09-27 (×10): qty 50

## 2015-09-27 NOTE — Progress Notes (Signed)
Chart reviewed will see today

## 2015-09-27 NOTE — Progress Notes (Addendum)
Patient refused MRI.Pateint stated he was "not in the mood". Patient was educated as to the importance of getting the MRI. Patient still declined MRI. PCP was notified.

## 2015-09-27 NOTE — Progress Notes (Signed)
PROGRESS NOTE                                                                                                                                                                                                             Patient Demographics:    Francis Clayton, is a 50 y.o. male, DOB - [birth year], MVH:[id number]  Admit date - 09/26/2015   Admitting Physician Lorretta Harp, MD  Outpatient Primary MD for the patient is No primary care provider on file.  LOS -   Chief Complaint  Patient presents with  . Foot Injury       Brief Narrative   Francis Clayton is a 50 y.o. male with medical history significant of tobacco abuse, COPD, gout in right great toe, who presents with right ankle and foot swelling and pain.  Pt states that he stepped off the back of a truck, and felt a pop in the top of his right foot on Thursday. Pt states since he has had pain in right foot and ankle. He states the foot and ankle area become red, swollen and warm today. His pain is burning like, constant, 8 out of 10 in severity, nonradiating. He took ibuprofen without help. He also has subjective fever and chills. No nausea, vomiting, diarrhea or abdominal pain. He has intermittent cough due to smoking, but no shortness breath or chest pain. He does not have symptoms of UTI, unilateral weakness or rashes.  ED Course: pt was found to have WBC 17.2, temperature 99.6, tachycardia, tachypnea, electrolytes and renal function okay. X-ray of her right ankle and foot showed soft tissue edema, but no bony fracture. Patient is placed on telemetry bed for observation.    Subjective:    Francis Clayton today has, No headache, No chest pain, No abdominal pain - No Nausea, No new weakness tingling or numbness, No Cough - SOB. +ve R.Ankle/foot pain.   Assessment  & Plan :    Principal Problem:   Cellulitis of right ankle Active Problems:   COPD (chronic obstructive pulmonary  disease) (HCC)   Tobacco abuse   Sepsis (HCC)   1.Cellulitis of the right ankle without any sepsis. No recorded fever, did have leukocytosis, lactate pro-calcitonin normal, blood pressure stable, nontoxic appearing, x-ray unremarkable, continue empiric antibiotic, follow cultures, question if there was a bug bite, already much better. Minimal  fluctuance will request orthopedics to evaluate to see if there is any drainable fluid. If stable discharge in 1-2 days.  2. Smoking. Counseled to quit.  3. History of COPD. At baseline. No wheezing. Supportive care.    Family Communication  :  None present  Code Status :  Full  Diet : Regular  Disposition Plan  :  Stay inpt  Consults  :  Ortho [facility]  Procedures  :     DVT Prophylaxis  :  Lovenox    Lab Results  Component Value Date   PLT 258 09/27/2015    Inpatient Medications  Scheduled Meds: . dextromethorphan-guaiFENesin  1 tablet Oral BID  . enoxaparin (LOVENOX) injection  40 mg Subcutaneous Q24H  . magnesium sulfate 1 - 4 g bolus IVPB  1 g Intravenous Once  . multivitamin with minerals  1 tablet Oral Daily  . nicotine  21 mg Transdermal Daily  . piperacillin-tazobactam (ZOSYN)  IV  3.375 g Intravenous Q8H  . [START ON 09/28/2015] pneumococcal 23 valent vaccine  0.5 mL Intramuscular Tomorrow-1000  . potassium chloride  40 mEq Oral Q6H  . sodium chloride flush  3 mL Intravenous Q12H  . vancomycin  1,000 mg Intravenous Q12H   Continuous Infusions: . sodium chloride 75 mL/hr at 09/27/15 0828   PRN Meds:.acetaminophen **OR** [DISCONTINUED] acetaminophen, ibuprofen, ipratropium-albuterol, ondansetron, oxyCODONE-acetaminophen  Antibiotics  :    Anti-infectives    Start     Dose/Rate Route Frequency Ordered Stop   09/27/15 1300  vancomycin (VANCOCIN) IVPB 1000 mg/200 mL premix     1,000 mg 200 mL/hr over 60 Minutes Intravenous Every 12 hours 09/27/15 0127     09/27/15 1000  piperacillin-tazobactam (ZOSYN) IVPB 3.375 g      3.375 g 12.5 mL/hr over 240 Minutes Intravenous Every 8 hours 09/27/15 0125     09/27/15 0130  piperacillin-tazobactam (ZOSYN) IVPB 3.375 g     3.375 g 100 mL/hr over 30 Minutes Intravenous  Once 09/27/15 0122 09/27/15 0400   09/27/15 0130  vancomycin (VANCOCIN) IVPB 1000 mg/200 mL premix  Status:  Discontinued     1,000 mg 200 mL/hr over 60 Minutes Intravenous  Once 09/27/15 0122 09/27/15 0124   09/27/15 0015  vancomycin (VANCOCIN) IVPB 1000 mg/200 mL premix     1,000 mg 200 mL/hr over 60 Minutes Intravenous  Once 09/27/15 0005 09/27/15 0214   09/27/15 0000  doxycycline (VIBRAMYCIN) 100 MG capsule  Status:  Discontinued     100 mg Oral 2 times daily 09/27/15 0030 09/27/15          Objective:   Filed Vitals:   09/26/15 2335 09/27/15 0153 09/27/15 0247 09/27/15 0630  BP: 112/80 111/77 125/73 95/67  Pulse: 104 101 94 71  Temp: 99.6 F (37.6 C) 98.5 F (36.9 C) 99.6 F (37.6 C) 98.2 F (36.8 C)  TempSrc: Oral Oral Oral Oral  Resp: 20 18 16 20   Height: 6' 4.5" (1.943 m)  6' 4.5" (1.943 m)   Weight: 83.915 kg (185 lb)     SpO2: 98% 96% 100% 97%    Wt Readings from Last 3 Encounters:  09/26/15 83.915 kg (185 lb)  02/03/14 83.915 kg (185 lb)     Intake/Output Summary (Last 24 hours) at 09/27/15 0908 Last data filed at 09/27/15 0731  Gross per 24 hour  Intake 1116.67 ml  Output      0 ml  Net 1116.67 ml     Physical Exam  Awake Alert, Oriented X  3, No new F.N deficits, Normal affect [place].AT,PERRAL Supple Neck,No JVD, No cervical lymphadenopathy appriciated.  Symmetrical Chest wall movement, Good air movement bilaterally, CTAB RRR,No Gallops,Rubs or new Murmurs, No Parasternal Heave +ve B.Sounds, Abd Soft, No tenderness, No organomegaly appriciated, No rebound - guarding or rigidity. No Cyanosis, Clubbing or edema, No new Rash or bruise Swelling and redness noted and demarcated above the right ankle on the medial aspect with minimal fluctuance    Data Review:     CBC  Recent Labs Lab 09/27/15 0001 09/27/15 0019 09/27/15 0359  WBC 17.2*  --  13.7*  HGB 14.2 15.0 12.0*  HCT 40.1 44.0 35.5*  PLT 296  --  258  MCV 88.5  --  91.0  MCH 31.3  --  30.8  MCHC 35.4  --  33.8  RDW 13.1  --  13.5  LYMPHSABS 2.0  --   --   MONOABS 1.9*  --   --   EOSABS 0.1  --   --   BASOSABS 0.0  --   --     Chemistries   Recent Labs Lab 09/27/15 0019 09/27/15 0359  NA 138 137  K 3.8 3.2*  CL 100* 107  CO2  --  24  GLUCOSE 105* 150*  BUN 14 12  CREATININE 1.00 0.90  CALCIUM  --  7.8*   ------------------------------------------------------------------------------------------------------------------ No results for input(s): CHOL, HDL, LDLCALC, TRIG, CHOLHDL, LDLDIRECT in the last 72 hours.  No results found for: HGBA1C ------------------------------------------------------------------------------------------------------------------ No results for input(s): TSH, T4TOTAL, T3FREE, THYROIDAB in the last 72 hours.  Invalid input(s): FREET3 ------------------------------------------------------------------------------------------------------------------ No results for input(s): VITAMINB12, FOLATE, FERRITIN, TIBC, IRON, RETICCTPCT in the last 72 hours.  Coagulation profile  Recent Labs Lab 09/27/15 0206  INR 1.10    No results for input(s): DDIMER in the last 72 hours.  Cardiac Enzymes No results for input(s): CKMB, TROPONINI, MYOGLOBIN in the last 168 hours.  Invalid input(s): CK ------------------------------------------------------------------------------------------------------------------ No results found for: BNP  Micro Results Recent Results (from the past 240 hour(s))  Culture, blood (routine x 2)     Status: None (Preliminary result)   Collection Time: 09/27/15  2:07 AM  Result Value Ref Range Status   Specimen Description   Final    BLOOD BLOOD [facility]EARM [facility]    Special Requests BOTTLES DRAWN  AEROBIC AND ANAEROBIC 5ML EA  Final   Culture PENDING  Incomplete   Report Status PENDING  Incomplete    Radiology Reports Dg Ankle Complete Right  09/27/2015  CLINICAL DATA:  Right foot and ankle pain. Stepped off back of a truck 3 days prior, felt a pop. Pain since that time. Redness and swelling today. EXAM: RIGHT ANKLE - COMPLETE 3+ VIEW COMPARISON:  None. FINDINGS: No fracture or dislocation. The alignment and joint spaces are maintained. Ankle mortise is preserved. No bony destructive change or periosteal reaction. There is soft tissue edema noted. No evidence joint effusion. IMPRESSION: Soft tissue edema.  No acute osseous abnormality. Electronically Signed   By: Rubye OaksMelanie  Ehinger M.D.   On: 09/27/2015 00:38   Dg Foot Complete Right  09/27/2015  CLINICAL DATA:  Right foot and ankle pain. Stepped off back of a truck 3 days prior, felt a pop. Pain since that time. Redness and swelling today. EXAM: RIGHT FOOT COMPLETE - 3+ VIEW COMPARISON:  None. FINDINGS: No acute fracture or subluxation. Degenerative change at the first metatarsal phalangeal joint with joint space narrowing and subchondral change. There  is a prominent os navicular. No bony destructive change or periosteal reaction. Questionable pes planus, these are nonweightbearing views. Dorsal soft tissue edema. IMPRESSION: Dorsal soft tissue edema.  No acute osseous abnormalities. 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 Electronically Signed   By: Rubye Oaks M.D.   On: 09/27/2015 00:40    Time Spent in minutes  30   SINGH,PRASHANT K M.D on 09/27/2015 at 9:08 AM  Between 7am to 7pm - Pager - [phone]  After 7pm go to [email or web address] - password [facility]  [facility] -  Office  [phone]

## 2015-09-27 NOTE — Progress Notes (Signed)
Right ankle infection Needs ankle mri

## 2015-09-27 NOTE — ED Notes (Signed)
Patient transported to X-ray 

## 2015-09-27 NOTE — Discharge Instructions (Signed)

## 2015-09-27 NOTE — H&P (Addendum)
History and Physical    Francis Clayton SFT:[id number] DOB: 1965/04/04 DOA: 09/26/2015  Referring MD/NP/PA:   PCP: No primary care provider on file.   Patient coming from:  The patient is coming from home.  At baseline, pt is independent for most of ADL.   Chief Complaint: right ankle and foot swelling and pain  HPI: Francis Clayton is a 50 y.o. male with medical history significant of tobacco abuse, COPD, gout in right great toe, who presents with right ankle and foot swelling and pain.  Pt states that he stepped off the back of a truck, and felt a pop in the top of his right foot on Thursday. Pt states since he has had pain in right foot and ankle. He states the foot and ankle area become red, swollen and warm today. His pain is burning like, constant, 8 out of 10 in severity, nonradiating. He took ibuprofen without help. He also has subjective fever and chills. No nausea, vomiting, diarrhea or abdominal pain. He has intermittent cough due to smoking, but no shortness breath or chest pain. He does not have symptoms of UTI, unilateral weakness or rashes.  ED Course: pt was found to have  WBC 17.2, temperature 99.6, tachycardia, tachypnea, electrolytes and renal function okay. X-ray of her right ankle and foot showed soft tissue edema, but no bony fracture. Patient is placed on telemetry bed for observation.   Review of Systems:   General: has fevers, chills, no changes in body weight, has fatigue HEENT: no blurry vision, hearing changes or sore throat Pulm: no dyspnea, has coughing, no wheezing CV: no chest pain, no palpitations Abd: no nausea, vomiting, abdominal pain, diarrhea, constipation GU: no dysuria, burning on urination, increased urinary frequency, hematuria  Ext: Right ankle and foot swelling and pain.  Neuro: no unilateral weakness, numbness, or tingling, no vision change or hearing loss Skin: no rash MSK: No muscle spasm, no deformity, no limitation of range of movement in  spin Heme: No easy bruising.  Travel history: No recent long distant travel.  Allergy: No Known Allergies  Past Medical History  Diagnosis Date  . Hernia, abdominal   . COPD (chronic obstructive pulmonary disease) (HCC)   . Gout   . Tobacco abuse     Past Surgical History  Procedure Laterality Date  . Hernia repair    . Shoulder surgery      Social History:  reports that he has been smoking Cigarettes.  He does not have any smokeless tobacco history on file. He reports that he does not drink alcohol or use illicit drugs.  Family History:  Family History  Problem Relation Age of Onset  . Cancer Mother   . Parkinson's disease Mother   . Emphysema Father      Prior to Admission medications   Medication Sig Start Date End Date Taking? Authorizing Provider  ibuprofen (ADVIL,MOTRIN) 200 MG tablet Take 600 mg by mouth every 6 (six) hours as needed (FOR PAIN.).   Yes Historical Provider, MD  Multiple Vitamin (MULTIVITAMIN WITH MINERALS) TABS tablet Take 1 tablet by mouth daily. FOR MEN.   Yes Historical Provider, MD    Physical Exam: Filed Vitals:   09/26/15 2335 09/27/15 0153  BP: 112/80 111/77  Pulse: 104 101  Temp: 99.6 F (37.6 C) 98.5 F (36.9 C)  TempSrc: Oral Oral  Resp: 20 18  Height: 6' 4.5" (1.943 m)   Weight: 83.915 kg (185 lb)   SpO2: 98% 96%   General: Not in  acute distress HEENT:       Eyes: PERRL, EOMI, no scleral icterus.       ENT: No discharge from the ears and nose, no pharynx injection, no tonsillar enlargement.        Neck: No JVD, no bruit, no mass felt. Heme: No neck lymph node enlargement. Cardiac: S1/S2, RRR, No murmurs, No gallops or rubs. Pulm: . No rales, wheezing, rhonchi or rubs. Abd: Soft, nondistended, nontender, no rebound pain, no organomegaly, BS present. GU: No hematuria Ext: 2+DP/PT pulse bilaterally. Has erythema, warmth, swelling and tenderness over right anterior ankle Musculoskeletal: No joint deformities, No joint redness  or warmth, no limitation of ROM in spin. Skin: No rashes.  Neuro: Alert, oriented X3, cranial nerves II-XII grossly intact, moves all extremities normally. Psych: Patient is not psychotic, no suicidal or hemocidal ideation.  Labs on Admission: I have personally reviewed following labs and imaging studies  CBC:  Recent Labs Lab 09/27/15 0001 09/27/15 0019  WBC 17.2*  --   NEUTROABS 13.2*  --   HGB 14.2 15.0  HCT 40.1 44.0  MCV 88.5  --   PLT 296  --    Basic Metabolic Panel:  Recent Labs Lab 09/27/15 0019  NA 138  K 3.8  CL 100*  GLUCOSE 105*  BUN 14  CREATININE 1.00   GFR: Estimated Creatinine Clearance: 106 mL/min (by C-G formula based on Cr of 1). Liver Function Tests: No results for input(s): AST, ALT, ALKPHOS, BILITOT, PROT, ALBUMIN in the last 168 hours. No results for input(s): LIPASE, AMYLASE in the last 168 hours. No results for input(s): AMMONIA in the last 168 hours. Coagulation Profile: No results for input(s): INR, PROTIME in the last 168 hours. Cardiac Enzymes: No results for input(s): CKTOTAL, CKMB, CKMBINDEX, TROPONINI in the last 168 hours. BNP (last 3 results) No results for input(s): PROBNP in the last 8760 hours. HbA1C: No results for input(s): HGBA1C in the last 72 hours. CBG: No results for input(s): GLUCAP in the last 168 hours. Lipid Profile: No results for input(s): CHOL, HDL, LDLCALC, TRIG, CHOLHDL, LDLDIRECT in the last 72 hours. Thyroid Function Tests: No results for input(s): TSH, T4TOTAL, FREET4, T3FREE, THYROIDAB in the last 72 hours. Anemia Panel: No results for input(s): VITAMINB12, FOLATE, FERRITIN, TIBC, IRON, RETICCTPCT in the last 72 hours. Urine analysis: No results found for: COLORURINE, APPEARANCEUR, LABSPEC, PHURINE, GLUCOSEU, HGBUR, BILIRUBINUR, KETONESUR, PROTEINUR, UROBILINOGEN, NITRITE, LEUKOCYTESUR Sepsis Labs: '@LABRCNTIP'$ (procalcitonin:4,lacticidven:4) )No results found for this or any previous visit (from the past  240 hour(s)).   Radiological Exams on Admission: Dg Ankle Complete Right  09/27/2015  CLINICAL DATA:  Right foot and ankle pain. Stepped off back of a truck 3 days prior, felt a pop. Pain since that time. Redness and swelling today. EXAM: RIGHT ANKLE - COMPLETE 3+ VIEW COMPARISON:  None. FINDINGS: No fracture or dislocation. The alignment and joint spaces are maintained. Ankle mortise is preserved. No bony destructive change or periosteal reaction. There is soft tissue edema noted. No evidence joint effusion. IMPRESSION: Soft tissue edema.  No acute osseous abnormality. Electronically Signed   By: Jeb Levering M.D.   On: 09/27/2015 00:38   Dg Foot Complete Right  09/27/2015  CLINICAL DATA:  Right foot and ankle pain. Stepped off back of a truck 3 days prior, felt a pop. Pain since that time. Redness and swelling today. EXAM: RIGHT FOOT COMPLETE - 3+ VIEW COMPARISON:  None. FINDINGS: No acute fracture or subluxation. Degenerative change at the first metatarsal  phalangeal joint with joint space narrowing and subchondral change. There is a prominent os navicular. No bony destructive change or periosteal reaction. Questionable pes planus, these are nonweightbearing views. Dorsal soft tissue edema. IMPRESSION: Dorsal soft tissue edema.  No acute osseous abnormalities. 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 Electronically Signed   By: Jeb Levering M.D.   On: 09/27/2015 00:40     EKG: Not done in ED, will get one.   Assessment/Plan Principal Problem:   Cellulitis of right ankle Active Problems:   COPD (chronic obstructive pulmonary disease) (HCC)   Tobacco abuse   Sepsis (HCC)   Cellulitis of right anterior ankle and sepsis: pt has erythema, warmth, swelling and tenderness over right anterior ankle, consistent with cellulitis. Patient is septic on admission with leukocytosis, tachycardia and tachypnea. Pending lactate level. Currently hemodynamically stable.  -  will place on tele bed for obs - Empiric antimicrobial treatment with vancomycin and Zosyn per pharmacy - PRN Zofran for nausea, and Percocet for pain - Blood cultures x 2  - ESR and CRP - will get Procalcitonin and trend lactic acid levels per sepsis protocol. - IVF: 2.5 L of NS bolus in ED, followed by 125 cc/h    ED Sepsis - Assessment   Performed at:    2:18 AM   Last Vitals:    Blood pressure 111/77, pulse 101, temperature 98.5 F (36.9 C), temperature source Oral, resp. rate 18, height 6' 4.5" (1.943 m), weight 83.915 kg (185 lb), SpO2 96 %.  Heart:      Regular rhythm Tachchycarida  Lungs:     Clear to auscultation  Capillary Refill:   <2 seconds    Peripheral Pulse (include location): Brachial pulse palpable   Skin (include color):              Normal    COPD (chronic obstructive pulmonary disease) ([place]): Lungs clear to auscultation. No acute exacerbation. -When necessary DuoNeb nebulizer for shortness of breath -When necessary Mucinex for cough  Tobacco abuse: -Did counseling about importance of quitting smoking -Nicotine patch  DVT ppx: SQ Lovenox Code Status: Full code Family Communication: None at bed side.   Disposition Plan:  Anticipate discharge back to previous home environment Consults called:  none Admission status: Obs / tele   Date of Service 09/27/2015    Ivor Costa [facility] Pager [phone]  If 7PM-7AM, please contact night-coverage [email or web address] Password [email or web address] 09/27/2015, 2:16 AM

## 2015-09-27 NOTE — Progress Notes (Signed)
Pharmacy Antibiotic Note  Francis Clayton is a 50 y.o. male admitted on 09/26/2015 with cellulitis.  Pharmacy has been consulted for Vancomycin & Zosyn dosing.  Plan:  Vancomycin 1gm IV q12h  Vancomycin trough goal: 10-15 mcg/ml  Zosyn 3.375gm IV q8h (each dose infused over 4 hrs)  Height: 6' 4.5" (194.3 cm) Weight: 185 lb (83.915 kg) IBW/kg (Calculated) : 87.95  Temp (24hrs), Avg:99.6 F (37.6 C), Min:99.6 F (37.6 C), Max:99.6 F (37.6 C)   Recent Labs Lab 09/27/15 0001 09/27/15 0019  WBC 17.2*  --   CREATININE  --  1.00    Estimated Creatinine Clearance: 106 mL/min (by C-G formula based on Cr of 1).   CrCl (N) = 91 ml/min  No Known Allergies  Antimicrobials this admission: 7/3 Vanc >>   7/3 Zosyn >>    Dose adjustments this admission:    Microbiology results: 7/3 BCx:    Thank you for allowing pharmacy to be a part of this patient's care.  Maryellen PilePoindexter, Monta Maiorana Trefz, PharmD 09/27/2015 1:28 AM

## 2015-09-28 ENCOUNTER — Observation Stay (HOSPITAL_COMMUNITY): Payer: Commercial Managed Care - PPO

## 2015-09-28 DIAGNOSIS — L03115 Cellulitis of right lower limb: Secondary | ICD-10-CM | POA: Diagnosis not present

## 2015-09-28 LAB — MAGNESIUM: MAGNESIUM: 2 mg/dL (ref 1.7–2.4)

## 2015-09-28 LAB — POTASSIUM: Potassium: 4.7 mmol/L (ref 3.5–5.1)

## 2015-09-28 MED ORDER — BISACODYL 5 MG PO TBEC
10.0000 mg | DELAYED_RELEASE_TABLET | Freq: Every day | ORAL | Status: DC | PRN
  Administered 2015-09-28: 10 mg via ORAL
  Filled 2015-09-28: qty 2

## 2015-09-28 MED ORDER — LORAZEPAM 1 MG PO TABS
1.0000 mg | ORAL_TABLET | Freq: Once | ORAL | Status: AC
  Administered 2015-09-28: 1 mg via ORAL
  Filled 2015-09-28: qty 1

## 2015-09-28 MED ORDER — GADOBENATE DIMEGLUMINE 529 MG/ML IV SOLN
20.0000 mL | Freq: Once | INTRAVENOUS | Status: AC | PRN
  Administered 2015-09-28: 17 mL via INTRAVENOUS

## 2015-09-28 MED ORDER — LORAZEPAM 2 MG/ML IJ SOLN
1.0000 mg | Freq: Once | INTRAMUSCULAR | Status: AC
  Administered 2015-09-28: 1 mg via INTRAVENOUS
  Filled 2015-09-28: qty 1

## 2015-09-28 NOTE — Progress Notes (Addendum)
PROGRESS NOTE                                                                                                                                                                                                             Patient Demographics:    Francis Clayton, is a 50 y.o. male, DOB - [birth year], ZOX:[id number]  Admit date - 09/26/2015   Admitting Physician Lorretta Harp, MD  Outpatient Primary MD for the patient is No primary care provider on file.  LOS -   Chief Complaint  Patient presents with  . Foot Injury       Brief Narrative   Francis Clayton is a 50 y.o. male with medical history significant of tobacco abuse, COPD, gout in right great toe, who presents with right ankle and foot swelling and pain.  Pt states that he stepped off the back of a truck, and felt a pop in the top of his right foot on Thursday. Pt states since he has had pain in right foot and ankle. He states the foot and ankle area become red, swollen and warm today. His pain is burning like, constant, 8 out of 10 in severity, nonradiating. He took ibuprofen without help. He also has subjective fever and chills. No nausea, vomiting, diarrhea or abdominal pain. He has intermittent cough due to smoking, but no shortness breath or chest pain. He does not have symptoms of UTI, unilateral weakness or rashes.  ED Course: pt was found to have WBC 17.2, temperature 99.6, tachycardia, tachypnea, electrolytes and renal function okay. X-ray of her right ankle and foot showed soft tissue edema, but no bony fracture. Patient is placed on telemetry bed for observation.    Subjective:    Francis Clayton today has, No headache, No chest pain, No abdominal pain - No Nausea, No new weakness tingling or numbness, No Cough - SOB. +ve R.Ankle/foot pain.   Assessment  & Plan :     1.Cellulitis of the right ankle without any sepsis. No recorded fever, did have leukocytosis, lactate  pro-calcitonin normal, blood pressure stable, nontoxic appearing, x-ray unremarkable, continue empiric antibiotic, follow cultures, question if there was a bug bite, already much better.   Minimal fluctuance have requested orthopedics to see the patient, discussed with Dr. August Saucer orthopedic surgery on 09/28/2015, he had ordered an MRI to rule out joint  involvement on 09/27/2015 which patient had refused, patient is now agreeable MRI will be done to make sure joint is intact. Orthopedics will follow. Painless passive ROM doubt has joint involvement.  2. Smoking. Counseled to quit.  3. History of COPD. At baseline. No wheezing. Supportive care.    Family Communication  :  None present  Code Status :  Full  Diet : Regular  Disposition Plan  :  Stay inpt  Consults  :  Ortho August Saucer  Procedures  :   MRI right ankle  DVT Prophylaxis  :  Lovenox    Lab Results  Component Value Date   PLT 258 09/27/2015    Inpatient Medications  Scheduled Meds: . dextromethorphan-guaiFENesin  1 tablet Oral BID  . enoxaparin (LOVENOX) injection  40 mg Subcutaneous Q24H  . LORazepam  1 mg Intravenous Once  . multivitamin with minerals  1 tablet Oral Daily  . nicotine  21 mg Transdermal Daily  . piperacillin-tazobactam (ZOSYN)  IV  3.375 g Intravenous Q8H  . pneumococcal 23 valent vaccine  0.5 mL Intramuscular Tomorrow-1000  . sodium chloride flush  3 mL Intravenous Q12H  . vancomycin  1,000 mg Intravenous Q12H   Continuous Infusions:   PRN Meds:.acetaminophen **OR** [DISCONTINUED] acetaminophen, ibuprofen, ipratropium-albuterol, ondansetron, oxyCODONE-acetaminophen  Antibiotics  :    Anti-infectives    Start     Dose/Rate Route Frequency Ordered Stop   09/27/15 1300  vancomycin (VANCOCIN) IVPB 1000 mg/200 mL premix     1,000 mg 200 mL/hr over 60 Minutes Intravenous Every 12 hours 09/27/15 0127     09/27/15 1000  piperacillin-tazobactam (ZOSYN) IVPB 3.375 g     3.375 g 12.5 mL/hr over 240  Minutes Intravenous Every 8 hours 09/27/15 0125     09/27/15 0130  piperacillin-tazobactam (ZOSYN) IVPB 3.375 g     3.375 g 100 mL/hr over 30 Minutes Intravenous  Once 09/27/15 0122 09/27/15 0400   09/27/15 0130  vancomycin (VANCOCIN) IVPB 1000 mg/200 mL premix  Status:  Discontinued     1,000 mg 200 mL/hr over 60 Minutes Intravenous  Once 09/27/15 0122 09/27/15 0124   09/27/15 0015  vancomycin (VANCOCIN) IVPB 1000 mg/200 mL premix     1,000 mg 200 mL/hr over 60 Minutes Intravenous  Once 09/27/15 0005 09/27/15 0214   09/27/15 0000  doxycycline (VIBRAMYCIN) 100 MG capsule  Status:  Discontinued     100 mg Oral 2 times daily 09/27/15 0030 09/27/15          Objective:   Filed Vitals:   09/27/15 1134 09/27/15 1403 09/27/15 2058 09/28/15 0521  BP:  111/72 95/64 92/76   Pulse:  86 71 65  Temp:  98.6 F (37 C) 98.2 F (36.8 C) 97.7 F (36.5 C)  TempSrc:  Oral Oral Oral  Resp:  Height:      Weight:      SpO2: 94% 98% 98% 99%    Wt Readings from Last 3 Encounters:  09/26/15 83.915 kg (185 lb)  02/03/14 83.915 kg (185 lb)     Intake/Output Summary (Last 24 hours) at 09/28/15 1022 Last data filed at 09/28/15 0532  Gross per 24 hour  Intake 1648.75 ml  Output   1625 ml  Net  23.75 ml     Physical Exam  Awake Alert, Oriented X 3, No new F.N deficits, Normal affect Francis Clayton,PERRAL Supple Neck,No JVD, No cervical lymphadenopathy appriciated.  Symmetrical Chest wall movement, Good air movement bilaterally, CTAB RRR,No Gallops,Rubs or  new Murmurs, No Parasternal Heave +ve B.Sounds, Abd Soft, No tenderness, No organomegaly appriciated, No rebound - guarding or rigidity. No Cyanosis, Clubbing or edema, No new Rash or bruise Swelling and redness noted and demarcated above the right ankle on the medial aspect with minimal fluctuance, painless passive ROM    Data Review:    CBC  Recent Labs Lab 09/27/15 0001 09/27/15 0019 09/27/15 0359  WBC 17.2*  --  13.7*  HGB  14.2 15.0 12.0*  HCT 40.1 44.0 35.5*  PLT 296  --  258  MCV 88.5  --  91.0  MCH 31.3  --  30.8  MCHC 35.4  --  33.8  RDW 13.1  --  13.5  LYMPHSABS 2.0  --   --   MONOABS 1.9*  --   --   EOSABS 0.1  --   --   BASOSABS 0.0  --   --     Chemistries   Recent Labs Lab 09/27/15 0019 09/27/15 0359 09/28/15 0433  NA 138 137  --   K 3.8 3.2* 4.7  CL 100* 107  --   CO2  --  24  --   GLUCOSE 105* 150*  --   BUN 14 12  --   CREATININE 1.00 0.90  --   CALCIUM  --  7.8*  --   MG  --   --  2.0   ------------------------------------------------------------------------------------------------------------------ No results for input(s): CHOL, HDL, LDLCALC, TRIG, CHOLHDL, LDLDIRECT in the last 72 hours.  No results found for: HGBA1C ------------------------------------------------------------------------------------------------------------------ No results for input(s): TSH, T4TOTAL, T3FREE, THYROIDAB in the last 72 hours.  Invalid input(s): FREET3 ------------------------------------------------------------------------------------------------------------------ No results for input(s): VITAMINB12, FOLATE, FERRITIN, TIBC, IRON, RETICCTPCT in the last 72 hours.  Coagulation profile  Recent Labs Lab 09/27/15 0206  INR 1.10    No results for input(s): DDIMER in the last 72 hours.  Cardiac Enzymes No results for input(s): CKMB, TROPONINI, MYOGLOBIN in the last 168 hours.  Invalid input(s): CK ------------------------------------------------------------------------------------------------------------------ No results found for: BNP  Micro Results Recent Results (from the past 240 hour(s))  Culture, blood (routine x 2)     Status: None (Preliminary result)   Collection Time: 09/27/15  2:07 AM  Result Value Ref Range Status   Specimen Description   Final    BLOOD BLOOD LEFT FOREARM Performed at [facility]    Special Requests BOTTLES DRAWN AEROBIC AND ANAEROBIC 5ML EA   Final   Culture PENDING  Incomplete   Report Status PENDING  Incomplete    Radiology Reports Dg Ankle Complete Right  09/27/2015  CLINICAL DATA:  Right foot and ankle pain. Stepped off back of a truck 3 days prior, felt a pop. Pain since that time. Redness and swelling today. EXAM: RIGHT ANKLE - COMPLETE 3+ VIEW COMPARISON:  None. FINDINGS: No fracture or dislocation. The alignment and joint spaces are maintained. Ankle mortise is preserved. No bony destructive change or periosteal reaction. There is soft tissue edema noted. No evidence joint effusion. IMPRESSION: Soft tissue edema.  No acute osseous abnormality. Electronically Signed   By: Rubye OaksMelanie  Ehinger M.D.   On: 09/27/2015 00:38   Dg Foot Complete Right  09/27/2015  CLINICAL DATA:  Right foot and ankle pain. Stepped off back of a truck 3 days prior, felt a pop. Pain since that time. Redness and swelling today. EXAM: RIGHT FOOT COMPLETE - 3+ VIEW COMPARISON:  None. FINDINGS: No acute fracture or subluxation. Degenerative change at the first metatarsal phalangeal joint  with joint space narrowing and subchondral change. There is a prominent os navicular. No bony destructive change or periosteal reaction. Questionable pes planus, these are nonweightbearing views. Dorsal soft tissue edema. IMPRESSION: Dorsal soft tissue edema.  No acute osseous abnormalities. 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 Electronically Signed   By: Rubye OaksMelanie  Ehinger M.D.   On: 09/27/2015 00:40    Time Spent in minutes  30   Chimene Salo K M.D on 09/28/2015 at 10:22 AM  Between 7am to 7pm - Pager - [phone]  After 7pm go to [email or web address] - password [email or web address]  [facility] -  Office  [phone]

## 2015-09-28 NOTE — Progress Notes (Signed)
Mri pending

## 2015-09-28 NOTE — Progress Notes (Signed)
09/28/15  1005  [facility] [phone] [facility] today per MD order.

## 2015-09-29 DIAGNOSIS — F17210 Nicotine dependence, cigarettes, uncomplicated: Secondary | ICD-10-CM | POA: Diagnosis present

## 2015-09-29 DIAGNOSIS — M25571 Pain in right ankle and joints of right foot: Secondary | ICD-10-CM | POA: Diagnosis present

## 2015-09-29 DIAGNOSIS — L02415 Cutaneous abscess of right lower limb: Secondary | ICD-10-CM | POA: Diagnosis present

## 2015-09-29 DIAGNOSIS — Z720 Tobacco use: Secondary | ICD-10-CM | POA: Diagnosis not present

## 2015-09-29 DIAGNOSIS — Z23 Encounter for immunization: Secondary | ICD-10-CM | POA: Diagnosis not present

## 2015-09-29 DIAGNOSIS — L03115 Cellulitis of right lower limb: Secondary | ICD-10-CM | POA: Diagnosis present

## 2015-09-29 DIAGNOSIS — J449 Chronic obstructive pulmonary disease, unspecified: Secondary | ICD-10-CM | POA: Diagnosis present

## 2015-09-29 DIAGNOSIS — K5900 Constipation, unspecified: Secondary | ICD-10-CM | POA: Diagnosis not present

## 2015-09-29 DIAGNOSIS — A419 Sepsis, unspecified organism: Secondary | ICD-10-CM | POA: Diagnosis present

## 2015-09-29 DIAGNOSIS — M109 Gout, unspecified: Secondary | ICD-10-CM | POA: Diagnosis present

## 2015-09-29 HISTORY — DX: Cellulitis of right lower limb: L03.115

## 2015-09-29 LAB — BASIC METABOLIC PANEL
ANION GAP: 5 (ref 5–15)
BUN: 7 mg/dL (ref 6–20)
CO2: 29 mmol/L (ref 22–32)
Calcium: 8.6 mg/dL — ABNORMAL LOW (ref 8.9–10.3)
Chloride: 105 mmol/L (ref 101–111)
Creatinine, Ser: 0.81 mg/dL (ref 0.61–1.24)
Glucose, Bld: 87 mg/dL (ref 65–99)
Potassium: 4.4 mmol/L (ref 3.5–5.1)
Sodium: 139 mmol/L (ref 135–145)

## 2015-09-29 LAB — URIC ACID: URIC ACID, SERUM: 1.8 mg/dL — AB (ref 4.4–7.6)

## 2015-09-29 NOTE — Progress Notes (Signed)
Right ankle superficial abcess Joint spared Needs i and d tomorrow dced lovenox Or tomorrow night

## 2015-09-29 NOTE — Progress Notes (Signed)
PROGRESS NOTE                                                                                                                                                                                                             Patient Demographics:    Francis Clayton, is a 50 y.o. male, DOB - 1966-02-26, ZOX:[id number]   Chief Complaint  Patient presents with  . Foot Injury       Brief Narrative   Francis Clayton is a 50 y.o. male with medical history significant of tobacco abuse, COPD, gout in right great toe, who presents with right ankle and foot swelling and pain.  Pt states that he stepped off the back of a truck, and felt a pop in the top of his right foot on Thursday. Pt states since he has had pain in right foot and ankle. He states the foot and ankle area become red, swollen and warm today. His pain is burning like, constant, 8 out of 10 in severity, nonradiating. He took ibuprofen without help. He also has subjective fever and chills. No nausea, vomiting, diarrhea or abdominal pain. He has intermittent cough due to smoking, but no shortness breath or chest pain. He does not have symptoms of UTI, unilateral weakness or rashes.  ED Course: pt was found to have WBC 17.2, temperature 99.6, tachycardia, tachypnea, electrolytes and renal function okay. X-ray of her right ankle and foot showed soft tissue edema, but no bony fracture. Patient is placed on telemetry bed for observation.    Subjective:    Francis Clayton    Assessment  & Plan :     1.Cellulitis of the right ankle; No recorded fever, did have leukocytosis, lactate pro-calcitonin normal, blood pressure stable, nontoxic appearing, x-ray unremarkable. - continue empiric antibiotic vancomycin and zosyn day 2.  - follow cultures, question if there was a bug bite,.  - Dr. August Saucer orthopedic surgery consulted on 09/28/2015. -MRI with cellulitis, some area of possible developing  abscess. Will follow ortho recommendation.   2. Smoking. Counseled to quit.  3. History of COPD. At baseline. No wheezing. Supportive care. 4-history of gout; will check uric acid level.    Family Communication  :  None present  Code Status :  Full  Diet : Regular  Disposition Plan  :  Stay inpt  Consults  :  Johnell Comingsrtho - [place]  Procedures  :   MRI right ankle  DVT Prophylaxis  :  Lovenox    Lab Results  Component Value Date   PLT 258 09/27/2015    Inpatient Medications  Scheduled Meds: . dextromethorphan-guaiFENesin  1 tablet Oral BID  . enoxaparin (LOVENOX) injection  40 mg Subcutaneous Q24H  . multivitamin with minerals  1 tablet Oral Daily  . nicotine  21 mg Transdermal Daily  . piperacillin-tazobactam (ZOSYN)  IV  3.375 g Intravenous Q8H  . sodium chloride flush  3 mL Intravenous Q12H  . vancomycin  1,000 mg Intravenous Q12H   Continuous Infusions:   PRN Meds:.acetaminophen **OR** [DISCONTINUED] acetaminophen, bisacodyl, ibuprofen, ipratropium-albuterol, ondansetron, oxyCODONE-acetaminophen  Antibiotics  :    Anti-infectives    Start     Dose/Rate Route Frequency Ordered Stop   09/27/15 1300  vancomycin (VANCOCIN) IVPB 1000 mg/200 mL premix     1,000 mg 200 mL/hr over 60 Minutes Intravenous Every 12 hours 09/27/15 0127     09/27/15 1000  piperacillin-tazobactam (ZOSYN) IVPB 3.375 g     3.375 g 12.5 mL/hr over 240 Minutes Intravenous Every 8 hours 09/27/15 0125     09/27/15 0130  piperacillin-tazobactam (ZOSYN) IVPB 3.375 g     3.375 g 100 mL/hr over 30 Minutes Intravenous  Once 09/27/15 0122 09/27/15 0400   09/27/15 0130  vancomycin (VANCOCIN) IVPB 1000 mg/200 mL premix  Status:  Discontinued     1,000 mg 200 mL/hr over 60 Minutes Intravenous  Once 09/27/15 0122 09/27/15 0124   09/27/15 0015  vancomycin (VANCOCIN) IVPB 1000 mg/200 mL premix     1,000 mg 200 mL/hr over 60 Minutes Intravenous  Once 09/27/15 0005 09/27/15 0214   09/27/15 0000  doxycycline  (VIBRAMYCIN) 100 MG capsule  Status:  Discontinued     100 mg Oral 2 times daily 09/27/15 0030 09/27/15          Objective:   Filed Vitals:   09/28/15 0521 09/28/15 1517 09/28/15 2047 09/29/15 0548  BP: 92/76 105/91 106/75 107/75  Pulse: 65 79 86 77  Temp: 97.7 F (36.5 C) 97.8 F (36.6 C) 98.1 F (36.7 C) 98.9 F (37.2 C)  TempSrc: Oral Oral Oral Oral  Resp: 18 16 18 18   Height:      Weight:      SpO2: 99% 99% 97% 96%    Wt Readings from Last 3 Encounters:  09/26/15 83.915 kg (185 lb)  02/03/14 83.915 kg (185 lb)     Intake/Output Summary (Last 24 hours) at 09/29/15 0759 Last data filed at 09/29/15 0548  Gross per 24 hour  Intake   1270 ml  Output    900 ml  Net    370 ml     Physical Exam  Awake Alert, Oriented X 3, No new F.N deficits, Normal affect [name].AT,PERRAL Supple Neck,No JVD, No cervical lymphadenopathy appriciated.  Symmetrical Chest wall movement, Good air movement bilaterally, CTAB Heart; RRR,No Gallops,Rubs or new Murmurs Abdomen; +ve B.Sounds, Abd Soft, No tenderness, No organomegaly appriciated, No rebound - guarding or rigidity. No Cyanosis, Clubbing or edema, No new Rash or bruise Right Lower extremity: Swelling and redness noted and demarcated at  right ankle on the medial aspect with minimal fluctuance.     Data Review:    CBC  Recent Labs Lab 09/27/15 0001 09/27/15 0019 09/27/15 0359  WBC 17.2*  --  13.7*  HGB 14.2 15.0 12.0*  HCT 40.1 44.0 35.5*  PLT 296  --  258  MCV 88.5  --  91.0  MCH 31.3  --  30.8  MCHC 35.4  --  33.8  RDW 13.1  --  13.5  LYMPHSABS 2.0  --   --   MONOABS 1.9*  --   --   EOSABS 0.1  --   --   BASOSABS 0.0  --   --     Chemistries   Recent Labs Lab 09/27/15 0019 09/27/15 0359 09/28/15 0433 09/29/15 0418  NA 138 137  --  139  K 3.8 3.2* 4.7 4.4  CL 100* 107  --  105  CO2  --  24  --  29  GLUCOSE 105* 150*  --  87  BUN 14 12  --  7  CREATININE 1.00 0.90  --  0.81  CALCIUM  --  7.8*  --   8.6*  MG  --   --  2.0  --    ------------------------------------------------------------------------------------------------------------------ No results for input(s): CHOL, HDL, LDLCALC, TRIG, CHOLHDL, LDLDIRECT in the last 72 hours.  No results found for: HGBA1C ------------------------------------------------------------------------------------------------------------------ No results for input(s): TSH, T4TOTAL, T3FREE, THYROIDAB in the last 72 hours.  Invalid input(s): FREET3 ------------------------------------------------------------------------------------------------------------------ No results for input(s): VITAMINB12, FOLATE, FERRITIN, TIBC, IRON, RETICCTPCT in the last 72 hours.  Coagulation profile  Recent Labs Lab 09/27/15 0206  INR 1.10    No results for input(s): DDIMER in the last 72 hours.  Cardiac Enzymes No results for input(s): CKMB, TROPONINI, MYOGLOBIN in the last 168 hours.  Invalid input(s): CK ------------------------------------------------------------------------------------------------------------------ No results found for: BNP  Micro Results Recent Results (from the past 240 hour(s))  Culture, blood (routine x 2)     Status: None (Preliminary result)   Collection Time: 09/27/15  2:07 AM  Result Value Ref Range Status   Specimen Description BLOOD BLOOD LEFT FOREARM  Final   Special Requests BOTTLES DRAWN AEROBIC AND ANAEROBIC 5ML EA  Final   Culture   Final    NO GROWTH 1 DAY Performed [facility]    Report Status PENDING  Incomplete  Culture, blood (routine x 2)     Status: None (Preliminary result)   Collection Time: 09/27/15  2:20 AM  Result Value Ref Range Status   Specimen Description BLOOD LEFT ANTECUBITAL  Final   Special Requests BOTTLES DRAWN AEROBIC AND ANAEROBIC 5ML EA  Final   Culture   Final    NO GROWTH 1 DAY Performed at [facility] [place]    Report Status PENDING  Incomplete    Radiology Reports Dg  Ankle Complete Right  09/27/2015  CLINICAL DATA:  Right foot and ankle pain. Stepped off back of a truck 3 days prior, felt a pop. Pain since that time. Redness and swelling today. EXAM: RIGHT ANKLE - COMPLETE 3+ VIEW COMPARISON:  None. FINDINGS: No fracture or dislocation. The alignment and joint spaces are maintained. Ankle mortise is preserved. No bony destructive change or periosteal reaction. There is soft tissue edema noted. No evidence joint effusion. IMPRESSION: Soft tissue edema.  No acute osseous abnormality. Electronically Signed   By: Rubye OaksMelanie  Ehinger M.D.   On: 09/27/2015 00:38   Mr Ankle Right W Wo Contrast  09/28/2015  CLINICAL DATA:  Ankle pain and swelling. Trauma 3 days ago. Evaluate for cellulitis. No previous relevant surgery. EXAM: MRI OF THE RIGHT ANKLE WITHOUT AND WITH CONTRAST TECHNIQUE: Multiplanar, multisequence MR imaging of the ankle was performed before and after the administration of intravenous contrast. CONTRAST:  17mL MULTIHANCE  GADOBENATE DIMEGLUMINE 529 MG/ML IV SOLN COMPARISON:  Radiographs 09/27/2015. FINDINGS: TENDONS Peroneal: Intact and normally positioned. Posteromedial: Intact and normally positioned. Anterior: Intact and normally positioned. There is soft tissue edema, enhancement and ill-defined fluid anterior to the tibialis anterior tendon, further described below. No significant tenosynovitis. Achilles: Intact. Plantar Fascia: Intact. LIGAMENTS Lateral: Intact. Medial: Intact. CARTILAGE AND BONES Ankle Joint: No significant ankle joint effusion. The talar dome and tibial plafond are intact. Subtalar Joints/Sinus Tarsi: Unremarkable. Bones: Mild degenerative changes are present at the Lisfranc joint without associated subluxation. There is no evidence of acute fracture, dislocation, bone destruction or abnormal enhancement. Incidental small vascular remnant noted within the calcaneal body. Other: There is prominent generalized subcutaneous edema and ill-defined fluid  at the ankle, extending into the dorsum of the midfoot. There are several ill-defined peripherally enhancing fluid collections in the subcutaneous that which are best seen on the post-contrast images. These include a collection anterior to the tibialis anterior tendon, measuring approximately 2.1 x 2.6 x 0.6 cm. Smaller less well-defined fluid collections are present lateral to the midfoot and within the pre Achilles fat. No evidence myositis or tenosynovitis. IMPRESSION: IMPRESSION 1. There is extensive edema and ill-defined fluid throughout the subcutaneous fat of the ankle, extending into the dorsum of the midfoot, consistent with cellulitis. There are several ill-defined superficial fluid collections as described above which could reflect early abscesses. 2. No evidence of myositis or tenosynovitis. 3. No evidence of osteomyelitis or septic joint. 4. The ankle tendons and ligaments are intact. Electronically Signed   By: Carey Bullocks M.D.   On: 09/28/2015 13:36   Dg Foot Complete Right  09/27/2015  CLINICAL DATA:  Right foot and ankle pain. Stepped off back of a truck 3 days prior, felt a pop. Pain since that time. Redness and swelling today. EXAM: RIGHT FOOT COMPLETE - 3+ VIEW COMPARISON:  None. FINDINGS: No acute fracture or subluxation. Degenerative change at the first metatarsal phalangeal joint with joint space narrowing and subchondral change. There is a prominent os navicular. No bony destructive change or periosteal reaction. Questionable pes planus, these are nonweightbearing views. Dorsal soft tissue edema. IMPRESSION: Dorsal soft tissue edema.  No acute osseous abnormalities. 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 Electronically Signed   By: Rubye Oaks M.D.   On: 09/27/2015 00:40    Time Spent in minutes  30   Regalado, Belkys A M.D on 09/29/2015 at 7:59 AM [phone] 7am to 7pm -  After 7pm go to [email or web address] - password [facility]  [facility] -   Office  [phone]

## 2015-09-30 ENCOUNTER — Encounter (HOSPITAL_COMMUNITY): Payer: Self-pay | Admitting: Anesthesiology

## 2015-09-30 ENCOUNTER — Encounter (HOSPITAL_COMMUNITY)
Admission: EM | Disposition: A | Discharge status: Home / Self Care | Payer: Self-pay | Source: Home / Self Care | Attending: Internal Medicine

## 2015-09-30 LAB — BASIC METABOLIC PANEL
Anion gap: 5 (ref 5–15)
BUN: 12 mg/dL (ref 6–20)
CALCIUM: 8.6 mg/dL — AB (ref 8.9–10.3)
CHLORIDE: 104 mmol/L (ref 101–111)
CO2: 30 mmol/L (ref 22–32)
CREATININE: 0.93 mg/dL (ref 0.61–1.24)
Glucose, Bld: 91 mg/dL (ref 65–99)
POTASSIUM: 4 mmol/L (ref 3.5–5.1)
SODIUM: 139 mmol/L (ref 135–145)

## 2015-09-30 LAB — CBC
HEMATOCRIT: 38 % — AB (ref 39.0–52.0)
HEMOGLOBIN: 12.6 g/dL — AB (ref 13.0–17.0)
MCH: 31 pg (ref 26.0–34.0)
MCHC: 33.2 g/dL (ref 30.0–36.0)
MCV: 93.4 fL (ref 78.0–100.0)
Platelets: 359 10*3/uL (ref 150–400)
RBC: 4.07 MIL/uL — AB (ref 4.22–5.81)
RDW: 13.2 % (ref 11.5–15.5)
WBC: 8.4 10*3/uL (ref 4.0–10.5)

## 2015-09-30 LAB — MRSA PCR SCREENING: MRSA by PCR: NEGATIVE

## 2015-09-30 LAB — VANCOMYCIN, TROUGH: VANCOMYCIN TR: 8 ug/mL — AB (ref 15–20)

## 2015-09-30 SURGERY — INCISION AND DRAINAGE, ABSCESS
Anesthesia: Choice | Laterality: Right

## 2015-09-30 MED ORDER — MIDAZOLAM HCL 2 MG/2ML IJ SOLN
INTRAMUSCULAR | Status: AC
  Filled 2015-09-30: qty 2

## 2015-09-30 MED ORDER — FENTANYL CITRATE (PF) 250 MCG/5ML IJ SOLN
INTRAMUSCULAR | Status: AC
  Filled 2015-09-30: qty 5

## 2015-09-30 MED ORDER — SODIUM CHLORIDE 0.45 % IV SOLN
INTRAVENOUS | Status: DC
  Administered 2015-10-01: 10:00:00 via INTRAVENOUS

## 2015-09-30 MED ORDER — VANCOMYCIN HCL 10 G IV SOLR
1500.0000 mg | Freq: Two times a day (BID) | INTRAVENOUS | Status: DC
  Administered 2015-09-30 – 2015-10-02 (×4): 1500 mg via INTRAVENOUS
  Filled 2015-09-30 (×5): qty 1500

## 2015-09-30 MED ORDER — LIDOCAINE HCL (CARDIAC) 20 MG/ML IV SOLN
INTRAVENOUS | Status: AC
  Filled 2015-09-30: qty 5

## 2015-09-30 MED ORDER — PROPOFOL 10 MG/ML IV BOLUS
INTRAVENOUS | Status: AC
  Filled 2015-09-30: qty 20

## 2015-09-30 MED ORDER — DEXAMETHASONE SODIUM PHOSPHATE 10 MG/ML IJ SOLN
INTRAMUSCULAR | Status: AC
  Filled 2015-09-30: qty 1

## 2015-09-30 MED ORDER — ONDANSETRON HCL 4 MG/2ML IJ SOLN
INTRAMUSCULAR | Status: AC
  Filled 2015-09-30: qty 2

## 2015-09-30 MED ORDER — CEFAZOLIN SODIUM-DEXTROSE 2-4 GM/100ML-% IV SOLN
2.0000 g | INTRAVENOUS | Status: AC
  Administered 2015-10-01: 2 g via INTRAVENOUS
  Filled 2015-09-30 (×2): qty 100

## 2015-09-30 MED ORDER — CHLORHEXIDINE GLUCONATE 4 % EX LIQD
60.0000 mL | Freq: Once | CUTANEOUS | Status: AC
  Administered 2015-10-01: 4 via TOPICAL
  Filled 2015-09-30: qty 60

## 2015-09-30 NOTE — Progress Notes (Signed)
While RN at lunch patient IV infiltrated with vanc infusing.   Upon assessment by another floor Rn IV was stopped and taken out, a new IV was placed.  Cold pack placed on skin and educated pt on importance of notifying RN if infiltration site worsens.  Will continue to monitor closely.

## 2015-09-30 NOTE — Progress Notes (Signed)
Patient ID: Francis Clayton, male   DOB: [birth year], 50 y.o.   MRN: [id number] Patient's irrigation and debridement of right ankle [facility]. Due to operating room availability the operating room [facility] had [facility] Thursday evening and the [facility]. I have discussed transfer of care with Dr. August Saucerean and the patient.

## 2015-09-30 NOTE — Progress Notes (Signed)
Pharmacy Antibiotic Note  Francis Clayton is a 50 y.o. male admitted on 09/26/2015 with cellulitis.  Pharmacy has been consulted for Vancomycin & Zosyn dosing.  Today is day 3 abx; with no overt osteo but possible abscess on MRI, ortho planning I&D for tonight.  Plan:  Will increase vancomycin to 1500 mg IV q12 to target troughs of 10-15 mcg/ml.  Anticipate will continue until OR cultures result  After discussion with MD, OK to stop Zosyn given suspected pathogens  Height: 6' 4.5" (194.3 cm) Weight: 185 lb (83.915 kg) IBW/kg (Calculated) : 87.95  Temp (24hrs), Avg:98.1 F (36.7 C), Min:97.9 F (36.6 C), Max:98.3 F (36.8 C)   Recent Labs Lab 09/27/15 0001 09/27/15 0019 09/27/15 0207 09/27/15 0359 09/29/15 0418 09/30/15 0424 09/30/15 1228  WBC 17.2*  --   --  13.7*  --  8.4  --   CREATININE  --  1.00  --  0.90 0.81 0.93  --   LATICACIDVEN  --   --  0.9 1.3  --   --   --   VANCOTROUGH  --   --   --   --   --   --  8*    Estimated Creatinine Clearance: 114 mL/min (by C-G formula based on Cr of 0.93).   CrCl (N) = 91 ml/min  No Known Allergies  Antimicrobials this admission: 7/3 Vanc >>   7/3 Zosyn >> 7/6  Dose adjustments this admission: 7/6: VT = 8 on 1g q12 hr; increase to 1500 mg q12 hr    Microbiology results: 7/3 BCx:  NGTD  Thank you for allowing pharmacy to be a part of this patient's care.  Bernadene Personrew Meryn Sarracino, PharmD, BCPS Pager: [phone] 09/30/2015, 2:33 PM

## 2015-09-30 NOTE — Consult Note (Signed)
Reason for Consult: Right ankle infection Referring Physician: Dr Frederic Jericho . Francis Clayton is an 50 y.o. male.  HPI: Francis Clayton is a 50 year old patient with several day history of right ankle pain.  He presented to the hospital with increasing pain and inability to weight-bear on the right ankle.  IV antibiotics were initiated.  Subsequent MRI scan with IV contrast demonstrated superficial abscess in the right ankle which did not communicate with the ankle joint.  He presents now for operative management and drainage of the abscess.  Past Medical History  Diagnosis Date  . Hernia, abdominal   . COPD (chronic obstructive pulmonary disease) ([place])   . Gout   . Tobacco abuse     Past Surgical History  Procedure Laterality Date  . Hernia repair    . Shoulder surgery      Family History  Problem Relation Age of Onset  . Cancer Mother   . Parkinson's disease Mother   . Emphysema Father     Social History:  reports that he has been smoking Cigarettes.  He does not have any smokeless tobacco history on file. He reports that he does not drink alcohol or use illicit drugs.  Allergies: No Known Allergies  Medications: I have reviewed the patient's current medications.  Results for orders placed or performed during the hospital encounter of 09/26/15 (from the past 48 hour(s))  Basic metabolic panel     Status: Abnormal   Collection Time: 09/29/15  4:18 AM  Result Value Ref Range   Sodium 139 135 - 145 mmol/L   Potassium 4.4 3.5 - 5.1 mmol/L   Chloride 105 101 - 111 mmol/L   CO2 29 22 - 32 mmol/L   Glucose, Bld 87 65 - 99 mg/dL   BUN 7 6 - 20 mg/dL   Creatinine, Ser 0.81 0.61 - 1.24 mg/dL   Calcium 8.6 (L) 8.9 - 10.3 mg/dL   GFR calc non Af Amer >60 >60 mL/min   GFR calc Af Amer >60 >60 mL/min    Comment: (NOTE) The eGFR has been calculated using the CKD EPI equation. This calculation has not been validated in all clinical situations. eGFR's persistently <60 mL/min signify possible  Chronic Kidney Disease.    Anion gap 5 5 - 15  Uric acid     Status: Abnormal   Collection Time: 09/29/15  4:18 AM  Result Value Ref Range   Uric Acid, Serum 1.8 (L) 4.4 - 7.6 mg/dL  CBC     Status: Abnormal   Collection Time: 09/30/15  4:24 AM  Result Value Ref Range   WBC 8.4 4.0 - 10.5 K/uL   RBC 4.07 (L) 4.22 - 5.81 MIL/uL   Hemoglobin 12.6 (L) 13.0 - 17.0 g/dL   HCT 38.0 (L) 39.0 - 52.0 %   MCV 93.4 78.0 - 100.0 fL   MCH 31.0 26.0 - 34.0 pg   MCHC 33.2 30.0 - 36.0 g/dL   RDW 13.2 11.5 - 15.5 %   Platelets 359 150 - 400 K/uL  Basic metabolic panel     Status: Abnormal   Collection Time: 09/30/15  4:24 AM  Result Value Ref Range   Sodium 139 135 - 145 mmol/L   Potassium 4.0 3.5 - 5.1 mmol/L   Chloride 104 101 - 111 mmol/L   CO2 30 22 - 32 mmol/L   Glucose, Bld 91 65 - 99 mg/dL   BUN 12 6 - 20 mg/dL   Creatinine, Ser 0.93 0.61 - 1.24 mg/dL  Calcium 8.6 (L) 8.9 - 10.3 mg/dL   GFR calc non Af Amer >60 >60 mL/min   GFR calc Af Amer >60 >60 mL/min    Comment: (NOTE) The eGFR has been calculated using the CKD EPI equation. This calculation has not been validated in all clinical situations. eGFR's persistently <60 mL/min signify possible Chronic Kidney Disease.    Anion gap 5 5 - 15  Vancomycin, trough     Status: Abnormal   Collection Time: 09/30/15 12:28 PM  Result Value Ref Range   Vancomycin Tr 8 (L) 15 - 20 ug/mL  MRSA PCR Screening     Status: None   Collection Time: 09/30/15 12:49 PM  Result Value Ref Range   MRSA by PCR NEGATIVE NEGATIVE    Comment:        The GeneXpert MRSA Assay (FDA approved for NASAL specimens only), is one component of a comprehensive MRSA colonization surveillance program. It is not intended to diagnose MRSA infection nor to guide or monitor treatment for MRSA infections.     No results found.  Review of Systems  Constitutional: Positive for fever.  HENT: Negative.   Eyes: Negative.   Respiratory: Negative.    Cardiovascular: Negative.   Gastrointestinal: Negative.   Genitourinary: Negative.   Musculoskeletal: Positive for joint pain.  Skin: Negative.   Neurological: Negative.   Endo/Heme/Allergies: Negative.   Psychiatric/Behavioral: Negative.    Blood pressure 122/76, pulse 75, temperature 98.2 F (36.8 C), temperature source Oral, resp. rate 20, height 6' 4.5" (1.943 m), weight 83.915 kg (185 lb), SpO2 95 %. Physical Exam  Constitutional: He appears well-developed.  HENT:  Head: Normocephalic.  Eyes: Pupils are equal, round, and reactive to light.  Neck: Normal range of motion.  Cardiovascular: Normal rate.   Respiratory: Effort normal.  Neurological: He is alert.  Skin: Skin is warm.  Psychiatric: He has a normal mood and affect.   right ankle demonstrates no tissue crepitus but there is some cellulitis and slight fluctuance anterior medial aspect of the ankle.  Compartments are soft.  Pedal pulses palpable.  Mild pain with passive range of motion of the ankle.  Palpable intact and functional anterior to posterior tib peroneal and Achilles tendon.  Swelling and erythema is present around the ankle medial malleolar region  Assessment/Plan: Impression is right ankle cellulitis and small abscess.  Plan inches and and drainage of infection.  Risks and benefits discussed with patient limited to infection or vessel damage possibility of incomplete eradication of the infection.  By MRI scanning the infection does not appear to involve the ankle joint itself.  Anticipate 1-2 days of postop IV antibiotics to be changed oral antibiotics with discharge home this weekend.  Francis Clayton 09/30/2015, 5:17 PM

## 2015-09-30 NOTE — Progress Notes (Addendum)
PROGRESS NOTE                                                                                                                                                                                                             Patient Demographics:    Francis Clayton, is a 50 y.o. male, DOB - June 25, 1965, RUE:[id number]   Chief Complaint  Patient presents with  . Foot Injury       Brief Narrative   Francis Clayton is a 50 y.o. male with medical history significant of tobacco abuse, COPD, gout in right great toe, who presents with right ankle and foot swelling and pain.  Pt states that he stepped off the back of a truck, and felt a pop in the top of his right foot on Thursday. Pt states since he has had pain in right foot and ankle. He states the foot and ankle area become red, swollen and warm today. His pain is burning like, constant, 8 out of 10 in severity, nonradiating. He took ibuprofen without help. He also has subjective fever and chills. No nausea, vomiting, diarrhea or abdominal pain. He has intermittent cough due to smoking, but no shortness breath or chest pain. He does not have symptoms of UTI, unilateral weakness or rashes.  ED Course: pt was found to have WBC 17.2, temperature 99.6, tachycardia, tachypnea, electrolytes and renal function okay. X-ray of her right ankle and foot showed soft tissue edema, but no bony fracture. Patient is placed on telemetry bed for observation.    Subjective:    Francis Clayton feeling better, no significant pain.    Assessment  & Plan :     1.Cellulitis of the right ankle; No recorded fever, did have leukocytosis, lactate pro-calcitonin normal, blood pressure stable, nontoxic appearing, x-ray unremarkable. - continue empiric antibiotic vancomycin day 2. Discontinue zosyn , if spike fever or WBC get worse will need to resume zosyn.  - follow cultures, question if there was a bug bite,.  - Dr.  August Saucer orthopedic surgery consulted on 09/28/2015. -MRI with cellulitis, some area of possible developing abscess. Plan for I and D today  -WBC trending down.   2. Smoking. Counseled to quit.  3. History of COPD. At baseline. No wheezing. Supportive care. 4-history of gout; uric acid low.    Family Communication  :  None present  Code Status :  Full  Diet : Regular  Disposition Plan  :  Stay inpt  Consults  :  Ortho August Saucer[facility] [place]  Procedures  :   MRI right ankle  DVT Prophylaxis  :  Lovenox    Lab Results  Component Value Date   PLT 359 09/30/2015    Inpatient Medications  Scheduled Meds: . dextromethorphan-guaiFENesin  1 tablet Oral BID  . multivitamin with minerals  1 tablet Oral Daily  . nicotine  21 mg Transdermal Daily  . piperacillin-tazobactam (ZOSYN)  IV  3.375 g Intravenous Q8H  . sodium chloride flush  3 mL Intravenous Q12H  . vancomycin  1,000 mg Intravenous Q12H   Continuous Infusions:   PRN Meds:.acetaminophen **OR** [DISCONTINUED] acetaminophen, bisacodyl, ibuprofen, ipratropium-albuterol, ondansetron, oxyCODONE-acetaminophen  Antibiotics  :    Anti-infectives    Start     Dose/Rate Route Frequency Ordered Stop   09/27/15 1300  vancomycin (VANCOCIN) IVPB 1000 mg/200 mL premix     1,000 mg 200 mL/hr over 60 Minutes Intravenous Every 12 hours 09/27/15 0127     09/27/15 1000  piperacillin-tazobactam (ZOSYN) IVPB 3.375 g     3.375 g 12.5 mL/hr over 240 Minutes Intravenous Every 8 hours 09/27/15 0125     09/27/15 0130  piperacillin-tazobactam (ZOSYN) IVPB 3.375 g     3.375 g 100 mL/hr over 30 Minutes Intravenous  Once 09/27/15 0122 09/27/15 0400   09/27/15 0130  vancomycin (VANCOCIN) IVPB 1000 mg/200 mL premix  Status:  Discontinued     1,000 mg 200 mL/hr over 60 Minutes Intravenous  Once 09/27/15 0122 09/27/15 0124   09/27/15 0015  vancomycin (VANCOCIN) IVPB 1000 mg/200 mL premix     1,000 mg 200 mL/hr over 60 Minutes Intravenous  Once 09/27/15 0005  09/27/15 0214   09/27/15 0000  doxycycline (VIBRAMYCIN) 100 MG capsule  Status:  Discontinued     100 mg Oral 2 times daily 09/27/15 0030 09/27/15          Objective:   Filed Vitals:   09/29/15 0548 09/29/15 1600 09/29/15 2132 09/30/15 0506  BP: 107/75 114/67 121/92 105/68  Pulse: 77 80 77 67  Temp: 98.9 F (37.2 C) 98.3 F (36.8 C) 98.1 F (36.7 C) 97.9 F (36.6 C)  TempSrc: Oral Oral Oral Oral  Resp: 18 20 18 18   Height:      Weight:      SpO2: 96% 96% 98% 96%    Wt Readings from Last 3 Encounters:  09/26/15 83.915 kg (185 lb)  02/03/14 83.915 kg (185 lb)     Intake/Output Summary (Last 24 hours) at 09/30/15 0941 Last data filed at 09/29/15 1355  Gross per 24 hour  Intake    200 ml  Output      0 ml  Net    200 ml     Physical Exam  Awake Alert, Oriented X 3, No new F.N deficits, Normal affect [place].AT,PERRAL Supple Neck,No JVD, No cervical lymphadenopathy appriciated.  Symmetrical Chest wall movement, Good air movement bilaterally, CTAB Heart; RRR,No Gallops,Rubs or new Murmurs Abdomen; +ve B.Sounds, Abd Soft, No tenderness, No organomegaly appriciated, No rebound - guarding or rigidity. No Cyanosis, Clubbing or edema, No new Rash or bruise Right Lower extremity: Swelling and redness noted and demarcated at  right ankle on the medial aspect with minimal fluctuance. Redness decreasing     Data Review:    CBC  Recent Labs Lab 09/27/15 0001 09/27/15 0019 09/27/15 0359 09/30/15 0424  WBC 17.2*  --  13.7* 8.4  HGB 14.2 15.0 12.0* 12.6*  HCT 40.1 44.0 35.5* 38.0*  PLT 296  --  258 359  MCV 88.5  --  91.0 93.4  MCH 31.3  --  30.8 31.0  MCHC 35.4  --  33.8 33.2  RDW 13.1  --  13.5 13.2  LYMPHSABS 2.0  --   --   --   MONOABS 1.9*  --   --   --   EOSABS 0.1  --   --   --   BASOSABS 0.0  --   --   --     Chemistries   Recent Labs Lab 09/27/15 0019 09/27/15 0359 09/28/15 0433 09/29/15 0418 09/30/15 0424  NA 138 137  --  139 139  K 3.8 3.2*  4.7 4.4 4.0  CL 100* 107  --  105 104  CO2  --  24  --  29 30  GLUCOSE 105* 150*  --  87 91  BUN 14 12  --  7 12  CREATININE 1.00 0.90  --  0.81 0.93  CALCIUM  --  7.8*  --  8.6* 8.6*  MG  --   --  2.0  --   --    ------------------------------------------------------------------------------------------------------------------ No results for input(s): CHOL, HDL, LDLCALC, TRIG, CHOLHDL, LDLDIRECT in the last 72 hours.  No results found for: HGBA1C ------------------------------------------------------------------------------------------------------------------ No results for input(s): TSH, T4TOTAL, T3FREE, THYROIDAB in the last 72 hours.  Invalid input(s): FREET3 ------------------------------------------------------------------------------------------------------------------ No results for input(s): VITAMINB12, FOLATE, FERRITIN, TIBC, IRON, RETICCTPCT in the last 72 hours.  Coagulation profile  Recent Labs Lab 09/27/15 0206  INR 1.10    No results for input(s): DDIMER in the last 72 hours.  Cardiac Enzymes No results for input(s): CKMB, TROPONINI, MYOGLOBIN in the last 168 hours.  Invalid input(s): CK ------------------------------------------------------------------------------------------------------------------ No results found for: BNP  Micro Results Recent Results (from the past 240 hour(s))  Culture, blood (routine x 2)     Status: None (Preliminary result)   Collection Time: 09/27/15  2:07 AM  Result Value Ref Range Status   Specimen Description BLOOD BLOOD LEFT FOREARM  Final   Special Requests BOTTLES DRAWN AEROBIC AND ANAEROBIC 5ML EA  Final   Culture   Final    NO GROWTH 2 DAYS [facility]    Report Status PENDING  Incomplete  Culture, blood (routine x 2)     Status: None (Preliminary result)   Collection Time: 09/27/15  2:20 AM  Result Value Ref Range Status   Specimen Description BLOOD LEFT ANTECUBITAL  Final   Special Requests  BOTTLES DRAWN AEROBIC AND ANAEROBIC 5ML EA  Final   Culture   Final    NO GROWTH 2 DAYS [facility]formed [facility]    Report Status PENDING  Incomplete    Radiology Reports Dg Ankle Complete Right  09/27/2015  CLINICAL DATA:  Right foot and ankle pain. Stepped off back of a truck 3 days prior, felt a pop. Pain since that time. Redness and swelling today. EXAM: RIGHT ANKLE - COMPLETE 3+ VIEW COMPARISON:  None. FINDINGS: No fracture or dislocation. The alignment and joint spaces are maintained. Ankle mortise is preserved. No bony destructive change or periosteal reaction. There is soft tissue edema noted. No evidence joint effusion. IMPRESSION: Soft tissue edema.  No acute osseous abnormality. Electronically Signed   By: Rubye OaksMelanie  Ehinger M.D.   On: 09/27/2015 00:38   Mr Ankle Right W Wo Contrast  09/28/2015  CLINICAL DATA:  Ankle pain and swelling. Trauma 3 days ago. Evaluate for cellulitis. No previous relevant surgery. EXAM: MRI OF THE RIGHT ANKLE WITHOUT AND WITH CONTRAST TECHNIQUE: Multiplanar, multisequence MR imaging of the ankle was performed before and after the administration of intravenous contrast. CONTRAST:  17mL MULTIHANCE GADOBENATE DIMEGLUMINE 529 MG/ML IV SOLN COMPARISON:  Radiographs 09/27/2015. FINDINGS: TENDONS Peroneal: Intact and normally positioned. Posteromedial: Intact and normally positioned. Anterior: Intact and normally positioned. There is soft tissue edema, enhancement and ill-defined fluid anterior to the tibialis anterior tendon, further described below. No significant tenosynovitis. Achilles: Intact. Plantar Fascia: Intact. LIGAMENTS Lateral: Intact. Medial: Intact. CARTILAGE AND BONES Ankle Joint: No significant ankle joint effusion. The talar dome and tibial plafond are intact. Subtalar Joints/Sinus Tarsi: Unremarkable. Bones: Mild degenerative changes are present at the Lisfranc joint without associated subluxation. There is no evidence of acute fracture,  dislocation, bone destruction or abnormal enhancement. Incidental small vascular remnant noted within the calcaneal body. Other: There is prominent generalized subcutaneous edema and ill-defined fluid at the ankle, extending into the dorsum of the midfoot. There are several ill-defined peripherally enhancing fluid collections in the subcutaneous that which are best seen on the post-contrast images. These include a collection anterior to the tibialis anterior tendon, measuring approximately 2.1 x 2.6 x 0.6 cm. Smaller less well-defined fluid collections are present lateral to the midfoot and within the pre Achilles fat. No evidence myositis or tenosynovitis. IMPRESSION: IMPRESSION 1. There is extensive edema and ill-defined fluid throughout the subcutaneous fat of the ankle, extending into the dorsum of the midfoot, consistent with cellulitis. There are several ill-defined superficial fluid collections as described above which could reflect early abscesses. 2. No evidence of myositis or tenosynovitis. 3. No evidence of osteomyelitis or septic joint. 4. The ankle tendons and ligaments are intact. Electronically Signed   By: Carey Bullocks M.D.   On: 09/28/2015 13:36   Dg Foot Complete Right  09/27/2015  CLINICAL DATA:  Right foot and ankle pain. Stepped off back of a truck 3 days prior, felt a pop. Pain since that time. Redness and swelling today. EXAM: RIGHT FOOT COMPLETE - 3+ VIEW COMPARISON:  None. FINDINGS: No acute fracture or subluxation. Degenerative change at the first metatarsal phalangeal joint with joint space narrowing and subchondral change. There is a prominent os navicular. No bony destructive change or periosteal reaction. Questionable pes planus, these are nonweightbearing views. Dorsal soft tissue edema. IMPRESSION: Dorsal soft tissue edema.  No acute osseous abnormalities. 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 Electronically Signed   By: Rubye Oaks M.D.   On:  09/27/2015 00:40    Time Spent in minutes  30   Brighton Delio A M.D on 09/30/2015 at 9:41 AM [phone] 7am to 7pm -  After 7pm go to [email or web address] - password [facility]  [facility] -  Office  [phone]

## 2015-09-30 NOTE — Anesthesia Preprocedure Evaluation (Deleted)
Anesthesia Evaluation    Reviewed: Allergy & Precautions, Patient's Chart, lab work & pertinent test results  History of Anesthesia Complications Negative for: history of anesthetic complications  Airway        Dental   Pulmonary COPD, Current Smoker,           Cardiovascular      Neuro/Psych negative neurological ROS  negative psych ROS   GI/Hepatic negative GI ROS, Neg liver ROS,   Endo/Other  negative endocrine ROS  Renal/GU negative Renal ROS     Musculoskeletal   Abdominal   Peds  Hematology   Anesthesia Other Findings   Reproductive/Obstetrics                             Anesthesia Physical Anesthesia Plan  ASA: II  Anesthesia Plan: General   Post-op Pain Management:    Induction: Intravenous  Airway Management Planned: LMA  Additional Equipment:   Intra-op Plan:   Post-operative Plan: Extubation in OR  Informed Consent:   Plan Discussed with:   Anesthesia Plan Comments:         Anesthesia Quick Evaluation

## 2015-10-01 ENCOUNTER — Inpatient Hospital Stay (HOSPITAL_COMMUNITY): Payer: Commercial Managed Care - PPO | Admitting: Certified Registered Nurse Anesthetist

## 2015-10-01 ENCOUNTER — Encounter (HOSPITAL_COMMUNITY)
Admission: EM | Disposition: A | Discharge status: Home / Self Care | Payer: Self-pay | Source: Home / Self Care | Attending: Internal Medicine

## 2015-10-01 DIAGNOSIS — A419 Sepsis, unspecified organism: Secondary | ICD-10-CM | POA: Diagnosis not present

## 2015-10-01 DIAGNOSIS — M25571 Pain in right ankle and joints of right foot: Secondary | ICD-10-CM | POA: Diagnosis not present

## 2015-10-01 HISTORY — PX: I & D EXTREMITY: SHX5045

## 2015-10-01 LAB — ABO/RH: ABO/RH(D): O POS

## 2015-10-01 LAB — TYPE AND SCREEN
ABO/RH(D): O POS
ANTIBODY SCREEN: NEGATIVE

## 2015-10-01 SURGERY — IRRIGATION AND DEBRIDEMENT EXTREMITY
Anesthesia: General | Laterality: Right

## 2015-10-01 MED ORDER — ONDANSETRON HCL 4 MG/2ML IJ SOLN
INTRAMUSCULAR | Status: AC
  Filled 2015-10-01: qty 4

## 2015-10-01 MED ORDER — PROPOFOL 10 MG/ML IV BOLUS
INTRAVENOUS | Status: DC | PRN
Start: 2015-10-01 — End: 2015-10-01
  Administered 2015-10-01: 200 mg via INTRAVENOUS

## 2015-10-01 MED ORDER — 0.9 % SODIUM CHLORIDE (POUR BTL) OPTIME
TOPICAL | Status: DC | PRN
Start: 2015-10-01 — End: 2015-10-01
  Administered 2015-10-01: 1000 mL

## 2015-10-01 MED ORDER — LIDOCAINE HCL (CARDIAC) 20 MG/ML IV SOLN
INTRAVENOUS | Status: DC | PRN
  Administered 2015-10-01: 100 mg via INTRAVENOUS

## 2015-10-01 MED ORDER — FENTANYL CITRATE (PF) 250 MCG/5ML IJ SOLN
INTRAMUSCULAR | Status: AC
  Filled 2015-10-01: qty 5

## 2015-10-01 MED ORDER — SODIUM CHLORIDE 0.9 % IV SOLN
INTRAVENOUS | Status: DC
  Administered 2015-10-01: 20:00:00 via INTRAVENOUS

## 2015-10-01 MED ORDER — FENTANYL CITRATE (PF) 100 MCG/2ML IJ SOLN
INTRAMUSCULAR | Status: DC | PRN
  Administered 2015-10-01 (×2): 100 ug via INTRAVENOUS

## 2015-10-01 MED ORDER — FENTANYL CITRATE (PF) 100 MCG/2ML IJ SOLN
25.0000 ug | INTRAMUSCULAR | Status: AC | PRN
  Administered 2015-10-01 (×3): 50 ug via INTRAVENOUS

## 2015-10-01 MED ORDER — HYDROMORPHONE HCL 1 MG/ML IJ SOLN
1.0000 mg | INTRAMUSCULAR | Status: DC | PRN
  Administered 2015-10-01: 1 mg via INTRAVENOUS
  Filled 2015-10-01: qty 1

## 2015-10-01 MED ORDER — LACTATED RINGERS IV SOLN: INTRAVENOUS | Status: DC

## 2015-10-01 MED ORDER — KETOROLAC TROMETHAMINE 15 MG/ML IJ SOLN
15.0000 mg | Freq: Four times a day (QID) | INTRAMUSCULAR | Status: AC
  Administered 2015-10-01 – 2015-10-02 (×3): 15 mg via INTRAVENOUS
  Filled 2015-10-01 (×3): qty 1

## 2015-10-01 MED ORDER — FENTANYL CITRATE (PF) 100 MCG/2ML IJ SOLN
INTRAMUSCULAR | Status: AC
  Administered 2015-10-01: 50 ug via INTRAVENOUS
  Filled 2015-10-01: qty 2

## 2015-10-01 MED ORDER — POLYETHYLENE GLYCOL 3350 17 G PO PACK
17.0000 g | PACK | Freq: Every day | ORAL | Status: DC
  Administered 2015-10-02 – 2015-10-03 (×2): 17 g via ORAL
  Filled 2015-10-01 (×2): qty 1

## 2015-10-01 MED ORDER — ONDANSETRON HCL 4 MG/2ML IJ SOLN
INTRAMUSCULAR | Status: DC | PRN
  Administered 2015-10-01: 4 mg via INTRAVENOUS

## 2015-10-01 MED ORDER — OXYCODONE HCL 5 MG PO TABS
5.0000 mg | ORAL_TABLET | ORAL | Status: DC | PRN
  Administered 2015-10-01 – 2015-10-03 (×11): 10 mg via ORAL
  Filled 2015-10-01 (×10): qty 2

## 2015-10-01 MED ORDER — OXYCODONE HCL 5 MG PO TABS
ORAL_TABLET | ORAL | Status: AC
  Filled 2015-10-01: qty 2

## 2015-10-01 MED ORDER — MIDAZOLAM HCL 5 MG/5ML IJ SOLN
INTRAMUSCULAR | Status: DC | PRN
  Administered 2015-10-01: 2 mg via INTRAVENOUS

## 2015-10-01 MED ORDER — DEXTROSE 5 % IV SOLN
500.0000 mg | Freq: Four times a day (QID) | INTRAVENOUS | Status: DC | PRN
  Filled 2015-10-01: qty 5

## 2015-10-01 MED ORDER — FENTANYL CITRATE (PF) 100 MCG/2ML IJ SOLN
INTRAMUSCULAR | Status: AC
  Administered 2015-10-01: 25 ug via INTRAVENOUS
  Filled 2015-10-01: qty 2

## 2015-10-01 MED ORDER — FENTANYL CITRATE (PF) 100 MCG/2ML IJ SOLN
INTRAMUSCULAR | Status: AC
  Filled 2015-10-01: qty 2

## 2015-10-01 MED ORDER — METHOCARBAMOL 500 MG PO TABS
500.0000 mg | ORAL_TABLET | Freq: Four times a day (QID) | ORAL | Status: DC | PRN
  Administered 2015-10-02 – 2015-10-03 (×5): 500 mg via ORAL
  Filled 2015-10-01 (×5): qty 1

## 2015-10-01 MED ORDER — LACTATED RINGERS IV SOLN
INTRAVENOUS | Status: DC
  Administered 2015-10-01: 16:00:00 via INTRAVENOUS

## 2015-10-01 MED ORDER — METOCLOPRAMIDE HCL 5 MG PO TABS: 5.0000 mg | ORAL_TABLET | Freq: Three times a day (TID) | ORAL | Status: DC | PRN

## 2015-10-01 MED ORDER — ACETAMINOPHEN 325 MG PO TABS
650.0000 mg | ORAL_TABLET | Freq: Four times a day (QID) | ORAL | Status: DC | PRN
  Administered 2015-10-02 – 2015-10-03 (×3): 650 mg via ORAL
  Filled 2015-10-01 (×2): qty 2

## 2015-10-01 MED ORDER — MIDAZOLAM HCL 2 MG/2ML IJ SOLN
INTRAMUSCULAR | Status: AC
  Filled 2015-10-01: qty 4

## 2015-10-01 MED ORDER — ONDANSETRON HCL 4 MG PO TABS: 4.0000 mg | ORAL_TABLET | Freq: Four times a day (QID) | ORAL | Status: DC | PRN

## 2015-10-01 MED ORDER — FENTANYL CITRATE (PF) 100 MCG/2ML IJ SOLN
25.0000 ug | INTRAMUSCULAR | Status: DC | PRN
  Administered 2015-10-01: 25 ug via INTRAVENOUS
  Administered 2015-10-01 (×2): 50 ug via INTRAVENOUS
  Administered 2015-10-01: 25 ug via INTRAVENOUS

## 2015-10-01 MED ORDER — ACETAMINOPHEN 650 MG RE SUPP: 650.0000 mg | Freq: Four times a day (QID) | RECTAL | Status: DC | PRN

## 2015-10-01 MED ORDER — SODIUM CHLORIDE 0.9 % IR SOLN
Status: DC | PRN
  Administered 2015-10-01: 1000 mL

## 2015-10-01 MED ORDER — METOCLOPRAMIDE HCL 5 MG/ML IJ SOLN: 5.0000 mg | Freq: Three times a day (TID) | INTRAMUSCULAR | Status: DC | PRN

## 2015-10-01 MED ORDER — ONDANSETRON HCL 4 MG/2ML IJ SOLN: 4.0000 mg | Freq: Four times a day (QID) | INTRAMUSCULAR | Status: DC | PRN

## 2015-10-01 MED ORDER — PROPOFOL 10 MG/ML IV BOLUS
INTRAVENOUS | Status: AC
  Filled 2015-10-01: qty 20

## 2015-10-01 SURGICAL SUPPLY — 41 items
BANDAGE ACE 4X5 VEL STRL LF (GAUZE/BANDAGES/DRESSINGS) ×2 IMPLANT
BLADE SURG 10 STRL SS (BLADE) IMPLANT
BLADE SURG 21 STRL SS (BLADE) ×2 IMPLANT
BNDG COHESIVE 4X5 TAN STRL (GAUZE/BANDAGES/DRESSINGS) ×2 IMPLANT
BNDG COHESIVE 6X5 TAN STRL LF (GAUZE/BANDAGES/DRESSINGS) IMPLANT
BNDG GAUZE ELAST 4 BULKY (GAUZE/BANDAGES/DRESSINGS) ×2 IMPLANT
COVER SURGICAL LIGHT HANDLE (MISCELLANEOUS) ×4 IMPLANT
DRAPE U-SHAPE 47X51 STRL (DRAPES) ×2 IMPLANT
DRSG ADAPTIC 3X8 NADH LF (GAUZE/BANDAGES/DRESSINGS) ×2 IMPLANT
DRSG EMULSION OIL 3X3 NADH (GAUZE/BANDAGES/DRESSINGS) ×2 IMPLANT
DURAPREP 26ML APPLICATOR (WOUND CARE) ×2 IMPLANT
ELECT CAUTERY BLADE 6.4 (BLADE) IMPLANT
ELECT REM PT RETURN 9FT ADLT (ELECTROSURGICAL)
ELECTRODE REM PT RTRN 9FT ADLT (ELECTROSURGICAL) IMPLANT
GAUZE SPONGE 4X4 12PLY STRL (GAUZE/BANDAGES/DRESSINGS) ×2 IMPLANT
GLOVE BIOGEL PI IND STRL 9 (GLOVE) ×1 IMPLANT
GLOVE BIOGEL PI INDICATOR 9 (GLOVE) ×1
GLOVE SURG ORTHO 9.0 STRL STRW (GLOVE) ×2 IMPLANT
GOWN STRL REUS W/ TWL LRG LVL3 (GOWN DISPOSABLE) ×1 IMPLANT
GOWN STRL REUS W/ TWL XL LVL3 (GOWN DISPOSABLE) ×2 IMPLANT
GOWN STRL REUS W/TWL LRG LVL3 (GOWN DISPOSABLE) ×1
GOWN STRL REUS W/TWL XL LVL3 (GOWN DISPOSABLE) ×2
HANDPIECE INTERPULSE COAX TIP (DISPOSABLE)
KIT BASIN OR (CUSTOM PROCEDURE TRAY) ×2 IMPLANT
KIT ROOM TURNOVER OR (KITS) ×2 IMPLANT
MANIFOLD NEPTUNE II (INSTRUMENTS) ×2 IMPLANT
NS IRRIG 1000ML POUR BTL (IV SOLUTION) ×4 IMPLANT
PACK ORTHO EXTREMITY (CUSTOM PROCEDURE TRAY) ×2 IMPLANT
PAD ARMBOARD 7.5X6 YLW CONV (MISCELLANEOUS) ×4 IMPLANT
SET HNDPC FAN SPRY TIP SCT (DISPOSABLE) IMPLANT
SPONGE GAUZE 4X4 12PLY STER LF (GAUZE/BANDAGES/DRESSINGS) ×2 IMPLANT
STOCKINETTE IMPERVIOUS 9X36 MD (GAUZE/BANDAGES/DRESSINGS) IMPLANT
SUT ETHILON 2 0 PSLX (SUTURE) ×4 IMPLANT
SWAB COLLECTION DEVICE MRSA (MISCELLANEOUS) ×2 IMPLANT
TOWEL OR 17X24 6PK STRL BLUE (TOWEL DISPOSABLE) ×2 IMPLANT
TOWEL OR 17X26 10 PK STRL BLUE (TOWEL DISPOSABLE) ×2 IMPLANT
TUBE ANAEROBIC SPECIMEN COL (MISCELLANEOUS) IMPLANT
TUBE CONNECTING 12X1/4 (SUCTIONS) ×2 IMPLANT
UNDERPAD 30X30 INCONTINENT (UNDERPADS AND DIAPERS) ×2 IMPLANT
WATER STERILE IRR 1000ML POUR (IV SOLUTION) ×2 IMPLANT
YANKAUER SUCT BULB TIP NO VENT (SUCTIONS) ×2 IMPLANT

## 2015-10-01 NOTE — Anesthesia Postprocedure Evaluation (Signed)
Anesthesia Post Note  Patient: Francis Clayton  Procedure(s) Performed: Procedure(s) (LRB): IRRIGATION AND DEBRIDEMENT EXTREMITY (Right)  Patient location during evaluation: PACU Anesthesia Type: General Level of consciousness: awake and alert Pain management: pain level controlled Vital Signs Assessment: post-procedure vital signs reviewed and stable Respiratory status: spontaneous breathing, nonlabored ventilation, respiratory function stable and patient connected to nasal cannula oxygen Cardiovascular status: blood pressure returned to baseline and stable Postop Assessment: no signs of nausea or vomiting Anesthetic complications: no    Last Vitals:  Filed Vitals:   10/01/15 1727 10/01/15 1742  BP: 129/94 124/87  Pulse: 71 69  Temp:    Resp: 12 14    Last Pain:  Filed Vitals:   10/01/15 1755  PainSc: 10-Worst pain ever                 Greyden Besecker L

## 2015-10-01 NOTE — Anesthesia Preprocedure Evaluation (Addendum)
Anesthesia Evaluation  Patient identified by MRN, date of birth, ID band Patient awake    Reviewed: Allergy & Precautions, H&P , NPO status , Patient's Chart, lab work & pertinent test results  Airway Mallampati: II  TM Distance: >3 FB Neck ROM: full    Dental no notable dental hx. (+) Dental Advisory Given, Teeth Intact   Pulmonary neg pulmonary ROS, COPD, Current Smoker,  Mild COPD   Pulmonary exam normal breath sounds clear to auscultation       Cardiovascular Exercise Tolerance: Good negative cardio ROS Normal cardiovascular exam Rhythm:regular Rate:Normal     Neuro/Psych negative neurological ROS  negative psych ROS   GI/Hepatic negative GI ROS, Neg liver ROS,   Endo/Other  negative endocrine ROS  Renal/GU negative Renal ROS  negative genitourinary   Musculoskeletal   Abdominal   Peds  Hematology negative hematology ROS (+)   Anesthesia Other Findings   Reproductive/Obstetrics negative OB ROS                            Anesthesia Physical Anesthesia Plan  ASA: II  Anesthesia Plan: General   Post-op Pain Management:    Induction: Intravenous  Airway Management Planned: LMA  Additional Equipment:   Intra-op Plan:   Post-operative Plan:   Informed Consent: I have reviewed the patients History and Physical, chart, labs and discussed the procedure including the risks, benefits and alternatives for the proposed anesthesia with the patient or authorized representative who has indicated his/her understanding and acceptance.   Dental Advisory Given  Plan Discussed with: CRNA  Anesthesia Plan Comments:        Anesthesia Quick Evaluation

## 2015-10-01 NOTE — Progress Notes (Signed)
[facility] Progress Note  Patient: Francis ClipperDurwood Limes QMV:[id number]   PCP: No primary care provider on file. DOB: May 09, 1965   DOA: 09/26/2015   DOS: 10/01/2015   Date of Service: the patient was seen and examined on 10/01/2015  Subjective: Complains of constipation. Denies any acute complaint. Pain is significant while the patient bears any weight on the right ankle. Nutrition: Tolerating oral diet  Brief hospital course: Pt. with PMH of COPD; admitted on 09/26/2015, with complaint of right ankle pain and swelling, was found to have right ankle cellulitis. Currently further plan is follow-up on incision and debridement.  Assessment and Plan: 1. Cellulitis of right ankle MRI negative for any osteomyelitis or any other acute abnormality. Lactic cellulitis. Patient is scheduled for incision and debridement on 10/01/2015. Next and will follow the results of the culture. Currently continue IV antibiotics.  2. Active smoker. Consulted to quit.   Pain management: When necessary morphine Activity: Consulted physical therapy Bowel regimen: last BM prior to admission Diet: Cardiac diet DVT Prophylaxis: subcutaneous Heparin  Advance goals of care discussion: Full code  Family Communication: no family was present at bedside, at the time of interview.   Disposition:  Discharge to home. Expected discharge date: 10/03/2015, postoperative course including  Consultants: Orthopedics Procedures: Incision and debridement  Antibiotics: Anti-infectives    Start     Dose/Rate Route Frequency Ordered Stop   10/01/15 0600  ceFAZolin (ANCEF) IVPB 2g/100 mL premix     2 g 200 mL/hr over 30 Minutes Intravenous To ShortStay Surgical 09/30/15 2158 10/02/15 0600   09/30/15 2200  vancomycin (VANCOCIN) 1,500 mg in sodium chloride 0.9 % 500 mL IVPB     1,500 mg 250 mL/hr over 120 Minutes Intravenous Every 12 hours 09/30/15 1439     09/27/15 1300  vancomycin (VANCOCIN) IVPB 1000 mg/200 mL premix  Status:   Discontinued     1,000 mg 200 mL/hr over 60 Minutes Intravenous Every 12 hours 09/27/15 0127 09/30/15 1439   09/27/15 1000  piperacillin-tazobactam (ZOSYN) IVPB 3.375 g  Status:  Discontinued     3.375 g 12.5 mL/hr over 240 Minutes Intravenous Every 8 hours 09/27/15 0125 09/30/15 1027   09/27/15 0130  piperacillin-tazobactam (ZOSYN) IVPB 3.375 g     3.375 g 100 mL/hr over 30 Minutes Intravenous  Once 09/27/15 0122 09/27/15 0400   09/27/15 0130  vancomycin (VANCOCIN) IVPB 1000 mg/200 mL premix  Status:  Discontinued     1,000 mg 200 mL/hr over 60 Minutes Intravenous  Once 09/27/15 0122 09/27/15 0124   09/27/15 0015  vancomycin (VANCOCIN) IVPB 1000 mg/200 mL premix     1,000 mg 200 mL/hr over 60 Minutes Intravenous  Once 09/27/15 0005 09/27/15 0214   09/27/15 0000  doxycycline (VIBRAMYCIN) 100 MG capsule  Status:  Discontinued     100 mg Oral 2 times daily 09/27/15 0030 09/27/15         Intake/Output Summary (Last 24 hours) at 10/01/15 1454 Last data filed at 10/01/15 1206  Gross per 24 hour  Intake      3 ml  Output   1200 ml  Net  -1197 ml   Filed Weights   09/26/15 2335  Weight: 83.915 kg (185 lb)    Objective: Physical Exam: Filed Vitals:   09/30/15 0506 09/30/15 1345 09/30/15 2133 10/01/15 0539  BP: 105/68 122/76 140/80 117/83  Pulse: 67 75 78 69  Temp: 97.9 F (36.6 C) 98.2 F (36.8 C) 99 F (37.2 C) 98 F (36.7 C)  TempSrc: Oral Oral Oral Oral  Resp: 18 20 20 18   Height:      Weight:      SpO2: 96% 95% 100% 100%    General: Alert, Awake and Oriented to Time, Place and Person. Appear in mild distress Eyes: PERRL, Conjunctiva normal ENT: Oral Mucosa clear moist. Neck: no JVD, no Abnormal Mass Or lumps Cardiovascular: S1 and S2 Present, no Murmur, Respiratory: Bilateral Air entry equal and Decreased, Clear to Auscultation, no Crackles, no wheezes Abdomen: Bowel Sound present, Soft and no tenderness Skin: no redness, no Rash, right ankle yellowish  discoloration with possible purulent abscess Extremities: no Pedal edema, no calf tenderness Neurologic: Grossly no focal neuro deficit. Bilaterally Equal motor strength  Data Reviewed: CBC:  Recent Labs Lab 09/27/15 0001 09/27/15 0019 09/27/15 0359 09/30/15 0424  WBC 17.2*  --  13.7* 8.4  NEUTROABS 13.2*  --   --   --   HGB 14.2 15.0 12.0* 12.6*  HCT 40.1 44.0 35.5* 38.0*  MCV 88.5  --  91.0 93.4  PLT 296  --  258 359   Basic Metabolic Panel:  Recent Labs Lab 09/27/15 0019 09/27/15 0359 09/28/15 0433 09/29/15 0418 09/30/15 0424  NA 138 137  --  139 139  K 3.8 3.2* 4.7 4.4 4.0  CL 100* 107  --  105 104  CO2  --  24  --  29 30  GLUCOSE 105* 150*  --  87 91  BUN 14 12  --  7 12  CREATININE 1.00 0.90  --  0.81 0.93  CALCIUM  --  7.8*  --  8.6* 8.6*  MG  --   --  2.0  --   --     Liver Function Tests: No results for input(s): AST, ALT, ALKPHOS, BILITOT, PROT, ALBUMIN in the last 168 hours. No results for input(s): LIPASE, AMYLASE in the last 168 hours. No results for input(s): AMMONIA in the last 168 hours. Coagulation Profile:  Recent Labs Lab 09/27/15 0206  INR 1.10   Cardiac Enzymes: No results for input(s): CKTOTAL, CKMB, CKMBINDEX, TROPONINI in the last 168 hours. BNP (last 3 results) No results for input(s): PROBNP in the last 8760 hours.  CBG: No results for input(s): GLUCAP in the last 168 hours.  Studies: No results found.   Scheduled Meds: .  ceFAZolin (ANCEF) IV  2 g Intravenous To SS-Surg  . dextromethorphan-guaiFENesin  1 tablet Oral BID  . multivitamin with minerals  1 tablet Oral Daily  . nicotine  21 mg Transdermal Daily  . polyethylene glycol  17 g Oral Daily  . sodium chloride flush  3 mL Intravenous Q12H  . vancomycin  1,500 mg Intravenous Q12H   Continuous Infusions: . sodium chloride 75 mL/hr at 10/01/15 0951   PRN Meds: acetaminophen **OR** [DISCONTINUED] acetaminophen, bisacodyl, ibuprofen, ipratropium-albuterol,  ondansetron, oxyCODONE-acetaminophen  Time spent: 30 minutes  Author: Lynden OxfordPranav Jamiyla Ishee, MD [facility] Pager: [phone] 10/01/2015 2:54 PM  If 7PM-7AM, please contact night-coverage at [email or web address], password [email or web address]

## 2015-10-01 NOTE — Interval H&P Note (Signed)
History and Physical Interval Note:  10/01/2015 7:01 AM  Francis Clayton  has presented today for surgery, with the diagnosis of Right Ankle Infection  The various methods of treatment have been discussed with the patient and family. After consideration of risks, benefits and other options for treatment, the patient has consented to  Procedure(s): IRRIGATION AND DEBRIDEMENT EXTREMITY (Right) as a surgical intervention .  The patient's history has been reviewed, patient examined, no change in status, stable for surgery.  I have reviewed the patient's chart and labs.  Questions were answered to the patient's satisfaction.     DUDA,MARCUS V

## 2015-10-01 NOTE — Transfer of Care (Signed)
Immediate Anesthesia Transfer of Care Note  Patient: Francis Clayton  Procedure(s) Performed: Procedure(s): IRRIGATION AND DEBRIDEMENT EXTREMITY (Right)  Patient Location: PACU  Anesthesia Type:General  Level of Consciousness: awake  Airway & Oxygen Therapy: Patient Spontanous Breathing and Patient connected to nasal cannula oxygen  Post-op Assessment: Report given to RN and Post -op Vital signs reviewed and stable  Post vital signs: stable  Last Vitals:  Filed Vitals:   10/01/15 0539 10/01/15 1516  BP: 117/83 109/76  Pulse: 69 67  Temp: 36.7 C 36.7 C  Resp: 18 16    Last Pain:  Filed Vitals:   10/01/15 1712  PainSc: 7       Patients Stated Pain Goal: 2 (10/01/15 0533)  Complications: No apparent anesthesia complications

## 2015-10-01 NOTE — Op Note (Signed)
09/26/2015 - 10/01/2015  4:56 PM  PATIENT:  Francis Clayton    PRE-OPERATIVE DIAGNOSIS:  Right Ankle Infection  POST-OPERATIVE DIAGNOSIS:  Same  PROCEDURE:  IRRIGATION AND DEBRIDEMENT DORSAL RIGHT ANKLE ABSCESS.  SURGEON:  Nadara MustardUDA,Wolfgang Finigan V, MD  PHYSICIAN ASSISTANT:None ANESTHESIA:   General  PREOPERATIVE INDICATIONS:  [name] is a  50 y.o. male with a diagnosis of Right Ankle Infection who failed conservative measures and elected for surgical management.    The risks benefits and alternatives were discussed with the patient preoperatively including but not limited to the risks of infection, bleeding, nerve injury, cardiopulmonary complications, the need for revision surgery, among others, and the patient was willing to proceed.  OPERATIVE IMPLANTS: None  OPERATIVE FINDINGS: Abscess superficial to the extensor tendon retinaculum and this did not involve the extensor tendons. There is a localized purulent abscess.  OPERATIVE PROCEDURE: Patient brought the operating room and underwent a general anesthetic. After adequate levels anesthesia obtained patient's right lower extremity was prepped using DuraPrep draped into a sterile field a timeout was called. A incision was made dorsally over the ankle approximately 8 cm in length a large hematoma was encountered. This was irrigated with pulsatile lavage. The extensor retinaculum was not involved and there was no evidence of any involvement of the extensor tendons. Soft tissue was removed with a Ronjair. Hemostasis was obtained with electrocautery. There was no full-thickness skin defect. The skin was closed using 2-0 nylon. A sterile compressive dressing was applied patient was extubated taken to the PACU in stable condition.  Would continue IV antibiotics for 48 hours with discharged to home on 2 weeks of oral antibiotics.   Patient may be weightbearing as tolerated.

## 2015-10-01 NOTE — Anesthesia Procedure Notes (Signed)
Procedure Name: LMA Insertion Date/Time: 10/01/2015 4:40 PM Performed by: Fanny DanceMULLINS, Nocholas Damaso L Pre-anesthesia Checklist: Patient identified, Emergency Drugs available, Suction available and Patient being monitored Patient Re-evaluated:Patient Re-evaluated prior to inductionOxygen Delivery Method: Circle system utilized Preoxygenation: Pre-oxygenation with 100% oxygen Intubation Type: IV induction Ventilation: Mask ventilation without difficulty LMA: LMA inserted LMA Size: 5.0 Number of attempts: 1 Tube secured with: Tape Dental Injury: Teeth and Oropharynx as per pre-operative assessment

## 2015-10-01 NOTE — Progress Notes (Signed)
Patient ID: Francis Clayton, male   DOB: 05/03/1965, 49 y.o.   MRN: [id number] Patient has persistent abscess anterior aspect right ankle between the anterior tibial tendon in the EHL. Plan for irrigation and debridement this afternoon. Nothing by mouth today. Patient to sign per minute for surgical intervention. Patient has no questions at this time. 

## 2015-10-01 NOTE — H&P (View-Only) (Signed)
Patient ID: Francis Clayton, male   DOB: April 14, 1965, 50 y.o.   MRN: [id number] Patient has persistent abscess anterior aspect right ankle between the anterior tibial tendon in the EHL. Plan for irrigation and debridement this afternoon. Nothing by mouth today. Patient to sign per minute for surgical intervention. Patient has no questions at this time.

## 2015-10-02 LAB — CBC
HEMATOCRIT: 41.5 % (ref 39.0–52.0)
HEMOGLOBIN: 13.4 g/dL (ref 13.0–17.0)
MCH: 30.5 pg (ref 26.0–34.0)
MCHC: 32.3 g/dL (ref 30.0–36.0)
MCV: 94.3 fL (ref 78.0–100.0)
Platelets: 393 10*3/uL (ref 150–400)
RBC: 4.4 MIL/uL (ref 4.22–5.81)
RDW: 12.7 % (ref 11.5–15.5)
WBC: 10.8 10*3/uL — ABNORMAL HIGH (ref 4.0–10.5)

## 2015-10-02 LAB — CULTURE, BLOOD (ROUTINE X 2)
CULTURE: NO GROWTH
Culture: NO GROWTH

## 2015-10-02 LAB — BASIC METABOLIC PANEL
ANION GAP: 8 (ref 5–15)
BUN: 12 mg/dL (ref 6–20)
CALCIUM: 8.6 mg/dL — AB (ref 8.9–10.3)
CHLORIDE: 103 mmol/L (ref 101–111)
CO2: 26 mmol/L (ref 22–32)
Creatinine, Ser: 0.89 mg/dL (ref 0.61–1.24)
GFR calc non Af Amer: >60 mL/min (ref >60)
GLUCOSE: 77 mg/dL (ref 65–99)
POTASSIUM: 4.2 mmol/L (ref 3.5–5.1)
Sodium: 137 mmol/L (ref 135–145)

## 2015-10-02 MED ORDER — SACCHAROMYCES BOULARDII 250 MG PO CAPS
250.0000 mg | ORAL_CAPSULE | Freq: Two times a day (BID) | ORAL | Status: DC
  Administered 2015-10-02 – 2015-10-03 (×3): 250 mg via ORAL
  Filled 2015-10-02 (×3): qty 1

## 2015-10-02 MED ORDER — CLINDAMYCIN PHOSPHATE 600 MG/50ML IV SOLN
600.0000 mg | Freq: Three times a day (TID) | INTRAVENOUS | Status: DC
  Administered 2015-10-02 – 2015-10-03 (×4): 600 mg via INTRAVENOUS
  Filled 2015-10-02 (×6): qty 50

## 2015-10-02 NOTE — Progress Notes (Signed)
[facility] Progress Note  Patient: Francis Clayton [id number]   PCP: No primary care provider on file. DOB: 20-Jan-1966   DOA: 09/26/2015   DOS: 10/02/2015   Date of Service: the patient was seen and examined on 10/02/2015  Subjective: Pain is well-controlled. No nausea no vomiting. No abdominal pain. Tolerated procedure well. Nutrition: Tolerating oral diet  Brief hospital course: Pt. with PMH of COPD; admitted on 09/26/2015, with complaint of right ankle pain and swelling, was found to have right ankle cellulitis. S/P incision and debridement of the right ankle abscess Currently further plan is continue IV antibiotics until 10/03/2015  Assessment and Plan: 1. Cellulitis of right ankle MRI negative for any osteomyelitis or any other acute abnormality. S/P incision and debridement on 10/01/2015.  No cultures obtained from the surgery, blood cultures negative from earlier. MRSA PCR negative. Would discontinue vancomycin and start the patient on clindamycin, continue IV clindamycin until 10/03/2015 and finish 2 weeks of course from the surgery. Get physical therapy evaluation. May require walker at home.  2. Active smoker. Counseling done to quit smoking.   Pain management: When necessary Percocet and Robaxin Activity: Consulted physical therapy Bowel regimen: last BM 09/30/2015 Diet: Cardiac diet DVT Prophylaxis: subcutaneous Heparin  Advance goals of care discussion: Full code  Family Communication: no family was present at bedside, at the time of interview.   Disposition:  Discharge to home. Expected discharge date: 10/03/2015, postoperative course  Consultants: Orthopedics Procedures: Incision and debridement  Antibiotics: Anti-infectives    Start     Dose/Rate Route Frequency Ordered Stop   10/02/15 0945  clindamycin (CLEOCIN) IVPB 600 mg     600 mg 100 mL/hr over 30 Minutes Intravenous Every 8 hours 10/02/15 0943     10/01/15 0600  ceFAZolin (ANCEF) IVPB 2g/100 mL  premix     2 g 200 mL/hr over 30 Minutes Intravenous To ShortStay Surgical 09/30/15 2158 10/01/15 1629   09/30/15 2200  vancomycin (VANCOCIN) 1,500 mg in sodium chloride 0.9 % 500 mL IVPB  Status:  Discontinued     1,500 mg 250 mL/hr over 120 Minutes Intravenous Every 12 hours 09/30/15 1439 10/02/15 0935   09/27/15 1300  vancomycin (VANCOCIN) IVPB 1000 mg/200 mL premix  Status:  Discontinued     1,000 mg 200 mL/hr over 60 Minutes Intravenous Every 12 hours 09/27/15 0127 09/30/15 1439   09/27/15 1000  piperacillin-tazobactam (ZOSYN) IVPB 3.375 g  Status:  Discontinued     3.375 g 12.5 mL/hr over 240 Minutes Intravenous Every 8 hours 09/27/15 0125 09/30/15 1027   09/27/15 0130  piperacillin-tazobactam (ZOSYN) IVPB 3.375 g     3.375 g 100 mL/hr over 30 Minutes Intravenous  Once 09/27/15 0122 09/27/15 0400   09/27/15 0130  vancomycin (VANCOCIN) IVPB 1000 mg/200 mL premix  Status:  Discontinued     1,000 mg 200 mL/hr over 60 Minutes Intravenous  Once 09/27/15 0122 09/27/15 0124   09/27/15 0015  vancomycin (VANCOCIN) IVPB 1000 mg/200 mL premix     1,000 mg 200 mL/hr over 60 Minutes Intravenous  Once 09/27/15 0005 09/27/15 0214   09/27/15 0000  doxycycline (VIBRAMYCIN) 100 MG capsule  Status:  Discontinued     100 mg Oral 2 times daily 09/27/15 0030 09/27/15         Intake/Output Summary (Last 24 hours) at 10/02/15 0944 Last data filed at 10/02/15 0829  Gross per 24 hour  Intake   1903 ml  Output   1450 ml  Net  453 ml   Filed Weights   09/26/15 2335  Weight: 83.915 kg (185 lb)    Objective: Physical Exam: Filed Vitals:   10/01/15 1757 10/01/15 2110 10/02/15 0243 10/02/15 0617  BP: 127/92 109/69 102/65 114/68  Pulse: 69 81 72 79  Temp:  97.7 F (36.5 C)  97.9 F (36.6 C)  TempSrc:  Oral  Oral  Resp: 11     Height:      Weight:      SpO2: 100% 94% 95% 95%    General: Alert, Awake and Oriented to Time, Place and Person. Appear in mild distress Eyes: Conjunctiva  normal ENT: Oral Mucosa clear moist. Cardiovascular: S1 and S2 Present, no Murmur, Respiratory: Bilateral Air entry equal and Decreased, Clear to Auscultation, no Crackles, no wheezes Abdomen: Bowel Sound present, Soft and no tenderness Skin: no redness, no Rash, right ankle wrapped  Extremities: no Pedal edema, no calf tenderness  Data Reviewed: CBC:  Recent Labs Lab 09/27/15 0001 09/27/15 0019 09/27/15 0359 09/30/15 0424 10/02/15 0519  WBC 17.2*  --  13.7* 8.4 10.8*  NEUTROABS 13.2*  --   --   --   --   HGB 14.2 15.0 12.0* 12.6* 13.4  HCT 40.1 44.0 35.5* 38.0* 41.5  MCV 88.5  --  91.0 93.4 94.3  PLT 296  --  258 359 393   Basic Metabolic Panel:  Recent Labs Lab 09/27/15 0019 09/27/15 0359 09/28/15 0433 09/29/15 0418 09/30/15 0424 10/02/15 0519  NA 138 137  --  139 139 137  K 3.8 3.2* 4.7 4.4 4.0 4.2  CL 100* 107  --  105 104 103  CO2  --  24  --  GLUCOSE 105* 150*  --  87 91 77  BUN 14 12  --  CREATININE 1.00 0.90  --  0.81 0.93 0.89  CALCIUM  --  7.8*  --  8.6* 8.6* 8.6*  MG  --   --  2.0  --   --   --     Liver Function Tests: No results for input(s): AST, ALT, ALKPHOS, BILITOT, PROT, ALBUMIN in the last 168 hours. No results for input(s): LIPASE, AMYLASE in the last 168 hours. No results for input(s): AMMONIA in the last 168 hours. Coagulation Profile:  Recent Labs Lab 09/27/15 0206  INR 1.10   Cardiac Enzymes: No results for input(s): CKTOTAL, CKMB, CKMBINDEX, TROPONINI in the last 168 hours. BNP (last 3 results) No results for input(s): PROBNP in the last 8760 hours.  CBG: No results for input(s): GLUCAP in the last 168 hours.  Studies: No results found.   Scheduled Meds: . clindamycin (CLEOCIN) IV  600 mg Intravenous Q8H  . dextromethorphan-guaiFENesin  1 tablet Oral BID  . ketorolac  15 mg Intravenous Q6H  . multivitamin with minerals  1 tablet Oral Daily  . nicotine  21 mg Transdermal Daily  . polyethylene glycol   17 g Oral Daily  . sodium chloride flush  3 mL Intravenous Q12H   Continuous Infusions: . sodium chloride 10 mL/hr at 10/01/15 2002  . lactated ringers 50 mL/hr at 10/01/15 1530   PRN Meds: acetaminophen **OR** acetaminophen, acetaminophen **OR** [DISCONTINUED] acetaminophen, bisacodyl, ibuprofen, ipratropium-albuterol, methocarbamol **OR** methocarbamol (ROBAXIN)  IV, metoCLOPramide **OR** metoCLOPramide (REGLAN) injection, ondansetron **OR** ondansetron (ZOFRAN) IV, oxyCODONE  Time spent: 30 minutes  Author: Lynden Oxford, MD [facility] Pager: [phone] 10/02/2015 9:44 AM  If 7PM-7AM, please contact night-coverage at [email or web address], password  [email or web address]

## 2015-10-02 NOTE — Progress Notes (Signed)
Patient ID: Francis Clayton, male   DOB: 23-Jan-1966, 50 y.o.   MRN: [id number] Dressing dry, pain controlled , on 48 hrs IV then PO ABx and discharge.

## 2015-10-02 NOTE — Evaluation (Signed)
Physical Therapy Evaluation Patient Details Name: Francis Clayton MRN: [id number] DOB: March 04, 1966 Today's Date: 10/02/2015   History of Present Illness  50 y.o. male admitted with Rt ankle cellulitis, now s/p I&D. PMH: COPD, gout.  Clinical Impression  Pt mobilizing well during initial PT session, able to ambulate 75 ft with rw and supervision for safety. Pt maintaining NWB at this time due to reports of pain but reinforcing WBAT status. Anticipate pt will D/C to home when medically stable with friends to assist. PT to continue to follow and progress mobility and determine if crutches or rw will be best device for home. Pt calling his friends to determine what he has at home.     Follow Up Recommendations No PT follow up;Supervision - Intermittent    Equipment Recommendations  Rolling walker with 5" wheels;Crutches    Recommendations for Other Services       Precautions / Restrictions Precautions Precautions: None Restrictions Weight Bearing Restrictions: Yes RLE Weight Bearing: Weight bearing as tolerated      Mobility  Bed Mobility Overal bed mobility: Modified Independent             General bed mobility comments: HOB elevated  Transfers Overall transfer level: Needs assistance Equipment used: Rolling walker (2 wheeled) Transfers: Sit to/from Stand Sit to Stand: Supervision         General transfer comment: supervision for safety  Ambulation/Gait Ambulation/Gait assistance: Supervision Ambulation Distance (Feet): 75 Feet Assistive device: Rolling walker (2 wheeled) Gait Pattern/deviations:  (swing-to pattern. ) Gait velocity: mild decrease   General Gait Details: Pt maintaining NWB through Rt LE despite reminder for WBAT status. Pt reports having too much pain to bear wt on Rt. Good stability and no loss of balance.   Stairs            Wheelchair Mobility    Modified Rankin (Stroke Patients Only)       Balance Overall balance assessment: Needs  assistance Sitting-balance support: No upper extremity supported Sitting balance-Leahy Scale: Good     Standing balance support: Single extremity supported Standing balance-Leahy Scale: Fair Standing balance comment: able to balance on single LE with min UE support.                              Pertinent Vitals/Pain Pain Assessment: 0-10 Pain Score: 6  Pain Location: Rt foot Pain Descriptors / Indicators: Shooting;Burning Pain Intervention(s): Limited activity within patient's tolerance;Monitored during session    Home Living Family/patient expects to be discharged to:: Private residence Living Arrangements: Non-relatives/Friends Available Help at Discharge: Friend(s);Available 24 hours/day Type of Home: House Home Access: Stairs to enter Entrance Stairs-Rails: Right Entrance Stairs-Number of Steps: 2 Home Layout: One level Home Equipment: Other (comment) (calling friends to check if he has rw or crutches at home. )      Prior Function Level of Independence: Independent               Hand Dominance        Extremity/Trunk Assessment   Upper Extremity Assessment: Overall WFL for tasks assessed           Lower Extremity Assessment: RLE deficits/detail RLE Deficits / Details: able to raise and move LE independent       Communication   Communication: No difficulties  Cognition Arousal/Alertness: Awake/alert Behavior During Therapy: WFL for tasks assessed/performed Overall Cognitive Status: Within Functional Limits for tasks assessed  General Comments      Exercises        Assessment/Plan    PT Assessment Patient needs continued PT services  PT Diagnosis Difficulty walking   PT Problem List Decreased strength;Decreased activity tolerance;Decreased balance;Decreased mobility  PT Treatment Interventions DME instruction;Gait training;Stair training;Functional mobility training;Therapeutic  activities;Therapeutic exercise;Patient/family education   PT Goals (Current goals can be found in the Care Plan section) Acute Rehab PT Goals Patient Stated Goal: go home PT Goal Formulation: With patient Time For Goal Achievement: 10/16/15 Potential to Achieve Goals: Good    Frequency Min 3X/week   Barriers to discharge        Co-evaluation               End of Session Equipment Utilized During Treatment:  (pt refused use of gait belt) Activity Tolerance: Patient tolerated treatment well Patient left: in chair;with call bell/phone within reach (Rt LE elevated) Nurse Communication: Mobility status;Weight bearing status         Time: 1610-9604 PT Time Calculation (min) (ACUTE ONLY): 24 min   Charges:   PT Evaluation $PT Eval Moderate Complexity: 1 Procedure PT Treatments $Gait Training: 8-22 mins   PT G Codes:        Christiane Ha, PT, CSCS Pager [phone] Office [phone]  10/02/2015, 11:42 AM

## 2015-10-03 ENCOUNTER — Encounter: Payer: Self-pay | Admitting: Internal Medicine

## 2015-10-03 MED ORDER — NICOTINE 21 MG/24HR TD PT24: 21.0000 mg | MEDICATED_PATCH | Freq: Every day | TRANSDERMAL | Status: DC

## 2015-10-03 MED ORDER — CLINDAMYCIN HCL 300 MG PO CAPS: 600.0000 mg | ORAL_CAPSULE | Freq: Three times a day (TID) | ORAL | Status: AC

## 2015-10-03 MED ORDER — HYDROCODONE-ACETAMINOPHEN 5-325 MG PO TABS: 1.0000 | ORAL_TABLET | Freq: Four times a day (QID) | ORAL | Status: DC | PRN

## 2015-10-03 MED ORDER — ACETAMINOPHEN 325 MG PO TABS
650.0000 mg | ORAL_TABLET | Freq: Four times a day (QID) | ORAL | Status: DC | PRN
Start: 2015-10-03 — End: 2019-11-12

## 2015-10-03 MED ORDER — METHOCARBAMOL 500 MG PO TABS: 500.0000 mg | ORAL_TABLET | Freq: Four times a day (QID) | ORAL | Status: DC | PRN

## 2015-10-03 MED ORDER — SACCHAROMYCES BOULARDII 250 MG PO CAPS: 250.0000 mg | ORAL_CAPSULE | Freq: Two times a day (BID) | ORAL | Status: DC

## 2015-10-03 MED ORDER — POLYETHYLENE GLYCOL 3350 17 G PO PACK: 17.0000 g | PACK | Freq: Every day | ORAL | Status: DC

## 2015-10-03 NOTE — Progress Notes (Signed)
Discharge instructions given and patient verbalized understanding. 

## 2015-10-03 NOTE — Progress Notes (Signed)
Physical Therapy Treatment Patient Details Name: Francis Clayton MRN: [id number] DOB: 1965/05/06 Today's Date: 10/03/2015    History of Present Illness 50 y.o. male admitted with Rt ankle cellulitis, now s/p I&D. PMH: COPD, gout.    PT Comments    Pt progressing well with mobility.  Tolerated WBing RLE today.  Pt requesting to trial gt with SPC.  Ambulated room<>ortho gym with cane with supervision.  Completed stair training this session.  Client states he would prefer cane for home but if insurance does not cover cost of cane he would like crutches.  Notified CM on floor and she states she will f/u with pt.      Follow Up Recommendations  No PT follow up;Supervision - Intermittent     Equipment Recommendations  Crutches;Cane    Recommendations for Other Services       Precautions / Restrictions Precautions Precautions: None Restrictions RLE Weight Bearing: Weight bearing as tolerated    Mobility  Bed Mobility Overal bed mobility: Modified Independent                Transfers Overall transfer level: Modified independent Equipment used: Straight cane                Ambulation/Gait Ambulation/Gait assistance: Supervision Ambulation Distance (Feet): 150 Feet Assistive device: Straight cane Gait Pattern/deviations: Step-through pattern;Decreased weight shift to right;Decreased step length - left;Step-to pattern;Decreased stance time - right;Antalgic     General Gait Details: pt requesting to trial SPC- tolerated increased WBing this session.  Progressing from step to gait to step through with increased fluidity of stepping as distance increased.  Antalgic gait but no LOB noted.     Stairs Stairs: Yes Stairs assistance: Supervision Stair Management: Forwards;With cane;One rail Right;Alternating pattern Number of Stairs:  (1 step 2x's; 5 steps with 1 rail + SPC) General stair comments: cues for technique  Wheelchair Mobility    Modified Rankin (Stroke  Patients Only)       Balance                                    Cognition Arousal/Alertness: Awake/alert Behavior During Therapy: WFL for tasks assessed/performed Overall Cognitive Status: Within Functional Limits for tasks assessed                      Exercises      General Comments        Pertinent Vitals/Pain Pain Assessment: 0-10 Pain Score: 7  Pain Location: rt ankle Pain Descriptors / Indicators: Aching;Burning;Shooting Pain Intervention(s): Monitored during session;Premedicated before session;Repositioned;RN gave pain meds during session    Home Living                      Prior Function            PT Goals (current goals can now be found in the care plan section) Acute Rehab PT Goals Patient Stated Goal: go home PT Goal Formulation: With patient Time For Goal Achievement: 10/16/15 Potential to Achieve Goals: Good Progress towards PT goals: Progressing toward goals    Frequency  Min 3X/week    PT Plan Current plan remains appropriate    Co-evaluation             End of Session   Activity Tolerance: Patient tolerated treatment well Patient left: in bed;with call bell/phone within reach     Time: [id number]  PT Time Calculation (min) (ACUTE ONLY): 27 min  Charges:  $Gait Training: 23-37 mins                    G Codes:      Lara Mulch 10/03/2015, 9:46 AM   Verdell Face, PTA [phone] 10/03/2015

## 2015-10-03 NOTE — Care Management Note (Signed)
Case Management Note  Patient Details  Name: Francis Clayton MRN: [id number] Date of Birth: [birth year]  Subjective/Objective:                  Cellulitis of right ankle  Action/Plan: CM spoke with patient at the bedside. Patient states the person he lives with has located a cane. He will use the cane at home. He lives with a friend and his family. May need a ride home. Discussed the availability of CSW to assist with transportation home if needed. Patient is unsure whether or not his girlfriend will pick him up today. Agrees to inform his nurse prior to 3 pm if he will need assistance with transportation home.   Expected Discharge Date:  09/29/15               Expected Discharge Plan:  Home/Self Care  In-House Referral:     Discharge planning Services  CM Consult  Post Acute Care Choice:    Choice offered to:     DME Arranged:  Patient refused services DME Agency:  NA  HH Arranged:  NA HH Agency:  NA  Status of Service:  Completed, signed off  If discussed at Long Length of Stay Meetings, dates discussed:    Additional Comments:  Antony HasteBennett, Humphrey Guerreiro Harris, RN 10/03/2015, 10:21 AM

## 2015-10-03 NOTE — Discharge Summary (Signed)
[facility] Discharge Summary   Patient: Francis Clayton QMV:[id number]   PCP: No primary care provider on file. DOB: 1966-02-13   Date of admission: 09/26/2015   Date of discharge: 10/03/2015     Discharge Diagnoses:  Principal Problem:   Cellulitis of right ankle Active Problems:   COPD (chronic obstructive pulmonary disease) (HCC)   Tobacco abuse   Sepsis (HCC)   Cellulitis of right lower leg  Admitted From: Home Disposition:  Home  Recommendations for Outpatient Follow-up:  1. Follow-up with orthopedic Dr. Audrie Lia office in 1 week for dressing change   Follow-up Information    Follow up with [facility] In 2 days.   Specialty:  Emergency Medicine   Why:  if not much better, return sooner for worsening symptoms.   Contact information:   [address] [id number] mc [address] [place] [address] [phone]      Follow up with [facility].   Specialty:  Behavioral Health   Why:  Please call for a PCP appointment.   Contact information:   [address] [place] [address] [phone]      Follow up with Nadara Mustard, MD In 3 weeks.   Specialty:  Orthopedic Surgery   Contact information:   [address] [place] [address] [phone]       Follow up with Nadara Mustard, MD. Call on 10/04/2015.   Specialty:  Orthopedic Surgery   Why:  to schedule , For wound re-check in 1 week   Contact information:   [address] [place] [address] [phone]      Diet recommendation: Regular diet  Activity: The patient is advised to gradually reintroduce usual activities.  Discharge Condition: good  Code Status: Full code  History of present illness: As per the H and P dictated on admission, "Francis Clayton is a 50 y.o. male with medical history significant of tobacco abuse, COPD, gout in right great toe, who presents with right ankle and foot swelling  and pain.  Pt states that he stepped off the back of a truck, and felt a pop in the top of his right foot on Thursday. Pt states since he has had pain in right foot and ankle. He states the foot and ankle area become red, swollen and warm today. His pain is burning like, constant, 8 out of 10 in severity, nonradiating. He took ibuprofen without help. He also has subjective fever and chills. No nausea, vomiting, diarrhea or abdominal pain. He has intermittent cough due to smoking, but no shortness breath or chest pain. He does not have symptoms of UTI, unilateral weakness or rashes."  Hospital Course:  Summary of his active problems in the hospital is as following. 1. Cellulitis of right ankle MRI negative for any osteomyelitis or any other acute abnormality. S/P incision and debridement on 10/01/2015.  No cultures obtained from the surgery, blood cultures negative from earlier. MRSA PCR negative. Patient was initially given vancomycin, after the procedure the patient was given IV clindamycin until 10/03/2015 and further the patient will 2 weeks of course from the surgery. PT evaluation was done which initially recommended walker but later on recommended cane which the patient already has at home. Probiotics were also provided. Patient will follow up with orthopedics office in one week for wound care. Since the patient's pain primarily occurs on weightbearing patient was recommended to be proactive before weightbearing to take pain medication.  2. Active  smoker. Counseling done to quit smoking.  Pain management: Orthopedic Dr. Lajoyce Corners postprocedure started the patient on When necessary Percocet and patient will be discharged on Norco and Robaxin, prescription provided for 5 days.  All other chronic medical condition were stable during the hospitalization.  Patient was seen by physical therapy, who recommended no therapy indicated at the time of the discharge. On the day of the discharge the  patient's vitals were stable, and no other acute medical condition were reported by patient. the patient was felt safe to be discharge at home with family.  Procedures and Results:  Incision and debridement , 10/01/2015  Consultations:  Orthopedics Dr. Melissa Noon MEDICATION: Discharge Medication List as of 10/03/2015  2:22 PM    START taking these medications   Details  acetaminophen (TYLENOL) 325 MG tablet Take 2 tablets (650 mg total) by mouth every 6 (six) hours as needed for mild pain (or Fever >/= 101)., Starting 10/03/2015, Until Discontinued, Print    clindamycin (CLEOCIN) 300 MG capsule Take 2 capsules (600 mg total) by mouth 3 (three) times daily., Starting 10/03/2015, Until Fri 10/15/15, Print    HYDROcodone-acetaminophen (NORCO) 5-325 MG tablet Take 1 tablet by mouth every 6 (six) hours as needed for moderate pain., Starting 10/03/2015, Until Discontinued, Print    methocarbamol (ROBAXIN) 500 MG tablet Take 1 tablet (500 mg total) by mouth every 6 (six) hours as needed for muscle spasms., Starting 10/03/2015, Until Discontinued, Print    nicotine (NICODERM CQ - DOSED IN MG/24 HOURS) 21 mg/24hr patch Place 1 patch (21 mg total) onto the skin daily., Starting 10/03/2015, Until Discontinued, Print    polyethylene glycol (MIRALAX / GLYCOLAX) packet Take 17 g by mouth daily., Starting 10/03/2015, Until Discontinued, Print    saccharomyces boulardii (FLORASTOR) 250 MG capsule Take 1 capsule (250 mg total) by mouth 2 (two) times daily., Starting 10/03/2015, Until Discontinued, Print      CONTINUE these medications which have NOT CHANGED   Details  ibuprofen (ADVIL,MOTRIN) 200 MG tablet Take 600 mg by mouth every 6 (six) hours as needed (FOR PAIN.)., Until Discontinued, Historical Med    Multiple Vitamin (MULTIVITAMIN WITH MINERALS) TABS tablet Take 1 tablet by mouth daily. FOR MEN., Until Discontinued, Historical Med       No Known Allergies Discharge Instructions    Diet - low sodium  heart healthy    Complete by:  As directed      Discharge instructions    Complete by:  As directed   It is important that you read following instructions as well as go over your medication list with RN to help you understand your care after this hospitalization.  Discharge Instructions: Please follow-up with PCP in one week  Please request your primary care physician to go over all Hospital Tests and Procedure/Radiological results at the follow up,  Please get all Hospital records sent to your PCP by signing hospital release before you go home.   Do not drive, operating heavy machinery, perform activities at heights, swimming or participation in water activities or provide baby sitting services while your are on Pain, Sleep and Anxiety Medications; until you have been seen by Primary Care Physician or a Neurologist and advised to do so again. Do not take more than prescribed Pain, Sleep and Anxiety Medications. You were cared for by a hospitalist during your hospital stay. If you have any questions about your discharge medications or the care you received while you were in the hospital after you  are discharged, you can call the unit and ask to speak with the hospitalist on call if the hospitalist that took care of you is not available.  Once you are discharged, your primary care physician will handle any further medical issues. Please note that NO REFILLS for any discharge medications will be authorized once you are discharged, as it is imperative that you return to your primary care physician (or establish a relationship with a primary care physician if you do not have one) for your aftercare needs so that they can reassess your need for medications and monitor your lab values. You Must read complete instructions/literature along with all the possible adverse reactions/side effects for all the Medicines you take and that have been prescribed to you. Take any new Medicines after you have completely  understood and accept all the possible adverse reactions/side effects. Wear Seat belts while driving. If you have smoked or chewed Tobacco in the last 2 yrs please stop smoking and/or stop any Recreational drug use.     Increase activity slowly    Complete by:  As directed           Discharge Exam: Filed Weights   09/26/15 2335  Weight: 83.915 kg (185 lb)   Filed Vitals:   10/03/15 0601 10/03/15 1421  BP: 108/72 120/78  Pulse: 68 90  Temp: 98.4 F (36.9 C) 97.6 F (36.4 C)  Resp:  16   General: Appear in no distress, no Rash; Oral Mucosa moist. Cardiovascular: S1 and S2 Present, no Murmur, no JVD Respiratory: Bilateral Air entry present and Clear to Auscultation, no Crackles, no wheezes Abdomen: Bowel Sound present, Soft and no tenderness Extremities: no Pedal edema, no calf tenderness, right leg is wrapped Neurology: Grossly no focal neuro deficit.  The results of significant diagnostics from this hospitalization (including imaging, microbiology, ancillary and laboratory) are listed below for reference.    Significant Diagnostic Studies: Dg Ankle Complete Right  09/27/2015  CLINICAL DATA:  Right foot and ankle pain. Stepped off back of a truck 3 days prior, felt a pop. Pain since that time. Redness and swelling today. EXAM: RIGHT ANKLE - COMPLETE 3+ VIEW COMPARISON:  None. FINDINGS: No fracture or dislocation. The alignment and joint spaces are maintained. Ankle mortise is preserved. No bony destructive change or periosteal reaction. There is soft tissue edema noted. No evidence joint effusion. IMPRESSION: Soft tissue edema.  No acute osseous abnormality. Electronically Signed   By: Rubye Oaks M.D.   On: 09/27/2015 00:38   Mr Ankle Right W Wo Contrast  09/28/2015  CLINICAL DATA:  Ankle pain and swelling. Trauma 3 days ago. Evaluate for cellulitis. No previous relevant surgery. EXAM: MRI OF THE RIGHT ANKLE WITHOUT AND WITH CONTRAST TECHNIQUE: Multiplanar, multisequence MR  imaging of the ankle was performed before and after the administration of intravenous contrast. CONTRAST:  17mL MULTIHANCE GADOBENATE DIMEGLUMINE 529 MG/ML IV SOLN COMPARISON:  Radiographs 09/27/2015. FINDINGS: TENDONS Peroneal: Intact and normally positioned. Posteromedial: Intact and normally positioned. Anterior: Intact and normally positioned. There is soft tissue edema, enhancement and ill-defined fluid anterior to the tibialis anterior tendon, further described below. No significant tenosynovitis. Achilles: Intact. Plantar Fascia: Intact. LIGAMENTS Lateral: Intact. Medial: Intact. CARTILAGE AND BONES Ankle Joint: No significant ankle joint effusion. The talar dome and tibial plafond are intact. Subtalar Joints/Sinus Tarsi: Unremarkable. Bones: Mild degenerative changes are present at the Lisfranc joint without associated subluxation. There is no evidence of acute fracture, dislocation, bone destruction or abnormal enhancement. Incidental small  vascular remnant noted within the calcaneal body. Other: There is prominent generalized subcutaneous edema and ill-defined fluid at the ankle, extending into the dorsum of the midfoot. There are several ill-defined peripherally enhancing fluid collections in the subcutaneous that which are best seen on the post-contrast images. These include a collection anterior to the tibialis anterior tendon, measuring approximately 2.1 x 2.6 x 0.6 cm. Smaller less well-defined fluid collections are present lateral to the midfoot and within the pre Achilles fat. No evidence myositis or tenosynovitis. IMPRESSION: IMPRESSION 1. There is extensive edema and ill-defined fluid throughout the subcutaneous fat of the ankle, extending into the dorsum of the midfoot, consistent with cellulitis. There are several ill-defined superficial fluid collections as described above which could reflect early abscesses. 2. No evidence of myositis or tenosynovitis. 3. No evidence of osteomyelitis or septic  joint. 4. The ankle tendons and ligaments are intact. Electronically Signed   By: Carey Bullocks M.D.   On: 09/28/2015 13:36   Dg Foot Complete Right  09/27/2015  CLINICAL DATA:  Right foot and ankle pain. Stepped off back of a truck 3 days prior, felt a pop. Pain since that time. Redness and swelling today. EXAM: RIGHT FOOT COMPLETE - 3+ VIEW COMPARISON:  None. FINDINGS: No acute fracture or subluxation. Degenerative change at the first metatarsal phalangeal joint with joint space narrowing and subchondral change. There is a prominent os navicular. No bony destructive change or periosteal reaction. Questionable pes planus, these are nonweightbearing views. Dorsal soft tissue edema. IMPRESSION: Dorsal soft tissue edema.  No acute osseous abnormalities. 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 Electronically Signed   By: Rubye Oaks M.D.   On: 09/27/2015 00:40    Microbiology: Recent Results (from the past 240 hour(s))  Culture, blood (routine x 2)     Status: None   Collection Time: 09/27/15  2:07 AM  Result Value Ref Range Status   Specimen Description BLOOD BLOOD LEFT FOREARM  Final   Special Requests BOTTLES DRAWN AEROBIC AND ANAEROBIC EA  Final   Culture   Final    NO GROWTH 5 DAYS Performed at [facility]    Report Status 10/02/2015 FINAL  Final  Culture, blood (routine x 2)     Status: None   Collection Time: 09/27/15  2:20 AM  Result Value Ref Range Status   Specimen Description BLOOD LEFT ANTECUBITAL  Final   Special Requests BOTTLES DRAWN AEROBIC AND ANAEROBIC EA  Final   Culture   Final    NO GROWTH 5 DAYS Performed at [facility]    Report Status 10/02/2015 FINAL  Final  MRSA PCR Screening     Status: None   Collection Time: 09/30/15 12:49 PM  Result Value Ref Range Status   MRSA by PCR NEGATIVE NEGATIVE Final    Comment:        The GeneXpert MRSA Assay (FDA approved for NASAL specimens only), is one component of  a comprehensive MRSA colonization surveillance program. It is not intended to diagnose MRSA infection nor to guide or monitor treatment for MRSA infections.      Labs: CBC:  Recent Labs Lab 09/27/15 0001 09/27/15 0019 09/27/15 0359 09/30/15 0424 10/02/15 0519  WBC 17.2*  --  13.7* 8.4 10.8*  NEUTROABS 13.2*  --   --   --   --   HGB 14.2 15.0 12.0* 12.6* 13.4  HCT 40.1 44.0 35.5* 38.0* 41.5  MCV 88.5  --  91.0  93.4 94.3  PLT 296  --  258 359 393   Basic Metabolic Panel:  Recent Labs Lab 09/27/15 0019 09/27/15 0359 09/28/15 0433 09/29/15 0418 09/30/15 0424 10/02/15 0519  NA 138 137  --  139 139 137  K 3.8 3.2* 4.7 4.4 4.0 4.2  CL 100* 107  --  105 104 103  CO2  --  24  --  29 30 26   GLUCOSE 105* 150*  --  87 91 77  BUN 14 12  --  7 12 12   CREATININE 1.00 0.90  --  0.81 0.93 0.89  CALCIUM  --  7.8*  --  8.6* 8.6* 8.6*  MG  --   --  2.0  --   --   --    Time spent: 30 minutes  Signed:  Roald Lukacs  [facility] 10/03/2015 , 11:42 PM

## 2015-10-04 ENCOUNTER — Encounter (HOSPITAL_COMMUNITY): Payer: Self-pay | Admitting: Orthopedic Surgery

## 2015-10-08 ENCOUNTER — Emergency Department (HOSPITAL_COMMUNITY)
Admission: EM | Admit: 2015-10-08 | Discharge: 2015-10-09 | Disposition: A | Discharge status: Home / Self Care | Payer: Commercial Managed Care - PPO | Attending: Emergency Medicine | Admitting: Emergency Medicine

## 2015-10-08 DIAGNOSIS — T8131XA Disruption of external operation (surgical) wound, not elsewhere classified, initial encounter: Secondary | ICD-10-CM | POA: Diagnosis not present

## 2015-10-08 DIAGNOSIS — Z791 Long term (current) use of non-steroidal anti-inflammatories (NSAID): Secondary | ICD-10-CM | POA: Insufficient documentation

## 2015-10-08 DIAGNOSIS — L03115 Cellulitis of right lower limb: Secondary | ICD-10-CM | POA: Diagnosis not present

## 2015-10-08 DIAGNOSIS — J449 Chronic obstructive pulmonary disease, unspecified: Secondary | ICD-10-CM | POA: Diagnosis not present

## 2015-10-08 DIAGNOSIS — F17210 Nicotine dependence, cigarettes, uncomplicated: Secondary | ICD-10-CM | POA: Insufficient documentation

## 2015-10-08 DIAGNOSIS — Z79899 Other long term (current) drug therapy: Secondary | ICD-10-CM | POA: Insufficient documentation

## 2015-10-08 DIAGNOSIS — Z4801 Encounter for change or removal of surgical wound dressing: Secondary | ICD-10-CM | POA: Diagnosis present

## 2015-10-08 DIAGNOSIS — Y829 Unspecified medical devices associated with adverse incidents: Secondary | ICD-10-CM | POA: Diagnosis not present

## 2015-10-08 NOTE — ED Provider Notes (Signed)
CSN: [id number]     Arrival date & time 10/08/15  2237 History  By signing my name below, I, Francis Clayton, attest that this documentation has been prepared under the direction and in the presence of Francis Forth, PA. Electronically Signed: Alyssa Clayton, ED Scribe. 10/08/2015. 11:07 PM.   Chief Complaint  Patient presents with  . Wound Check    The history is provided by the patient and medical records. No language interpreter was used.   HPI Comments: Francis Clayton is a 50 y.o. male with PMHx of Gout and COPD who presents to the Emergency Department for a wound check on his right ankle. Pt states he had to walk 6 miles this morning and that his wound has since opened up from the walk. He states his wound has since become red and his right foot has become swollen since his walk. He states he was taking his antibiotics, but has not been willing to take them for 2 days as it causes GI upset. Pt states he was prescribed Percocet and Oxycodone, then Tylenol for pain relief while in the hospital. Pt reports he took his prescribed Vicodin at home but ran out within 2 days. Pt reports his follow-up appointment with Dr. Lajoyce Corners is on 10/22/2015. Pt denies nausea or vomiting, fever.  Past Medical History  Diagnosis Date  . Hernia, abdominal   . COPD (chronic obstructive pulmonary disease) (HCC)   . Gout   . Tobacco abuse    Past Surgical History  Procedure Laterality Date  . Hernia repair    . Shoulder surgery    . I&d extremity Right 10/01/2015    Procedure: IRRIGATION AND DEBRIDEMENT EXTREMITY;  Surgeon: Nadara Mustard, MD;  Location: [facility] OR;  Service: Orthopedics;  Laterality: Right;   Family History  Problem Relation Age of Onset  . Cancer Mother   . Parkinson's disease Mother   . Emphysema Father    Social History  Substance Use Topics  . Smoking status: Current Every Day Smoker    Types: Cigarettes  . Smokeless tobacco: Not on file  . Alcohol Use: No    Review of Systems   Constitutional: Negative for fever, diaphoresis, appetite change, fatigue and unexpected weight change.  HENT: Negative for mouth sores.   Eyes: Negative for visual disturbance.  Respiratory: Negative for cough, chest tightness, shortness of breath and wheezing.   Cardiovascular: Negative for chest pain.  Gastrointestinal: Positive for nausea ( with abx) and diarrhea (loose stool with abx). Negative for vomiting, abdominal pain and constipation.  Endocrine: Negative for polydipsia, polyphagia and polyuria.  Genitourinary: Negative for dysuria, urgency, frequency and hematuria.  Musculoskeletal: Negative for back pain and neck stiffness.  Skin: Positive for wound. Negative for rash.  Allergic/Immunologic: Negative for immunocompromised state.  Neurological: Negative for syncope, light-headedness and headaches.  Hematological: Does not bruise/bleed easily.  Psychiatric/Behavioral: Negative for sleep disturbance. The patient is not nervous/anxious.   All other systems reviewed and are negative.   Allergies  Review of patient's allergies indicates no known allergies.  Home Medications   Prior to Admission medications   Medication Sig Start Date End Date Taking? Authorizing Provider  ibuprofen (ADVIL,MOTRIN) 200 MG tablet Take 600 mg by mouth every 6 (six) hours as needed (FOR PAIN.).   Yes Historical Provider, MD  Multiple Vitamin (MULTIVITAMIN WITH MINERALS) TABS tablet Take 1 tablet by mouth daily. FOR MEN.   Yes Historical Provider, MD  acetaminophen (TYLENOL) 325 MG tablet Take 2 tablets (650 mg total)  by mouth every 6 (six) hours as needed for mild pain (or Fever >/= 101). Patient not taking: Reported on 10/09/2015 10/03/15   Rolly SalterPranav M Patel, MD  clindamycin (CLEOCIN) 300 MG capsule Take 2 capsules (600 mg total) by mouth 3 (three) times daily. Patient not taking: Reported on 10/09/2015 10/03/15 10/15/15  Rolly SalterPranav M Patel, MD  HYDROcodone-acetaminophen (NORCO) 5-325 MG tablet Take 1 tablet by  mouth every 6 (six) hours as needed for moderate pain. Patient not taking: Reported on 10/09/2015 10/03/15   Rolly SalterPranav M Patel, MD  methocarbamol (ROBAXIN) 500 MG tablet Take 1 tablet (500 mg total) by mouth every 6 (six) hours as needed for muscle spasms. Patient not taking: Reported on 10/09/2015 10/03/15   Rolly SalterPranav M Patel, MD  nicotine (NICODERM CQ - DOSED IN MG/24 HOURS) 21 mg/24hr patch Place 1 patch (21 mg total) onto the skin daily. Patient not taking: Reported on 10/09/2015 10/03/15   Rolly SalterPranav M Patel, MD  polyethylene glycol [facility](MIRALAX / Ethelene HalGLYCOLAX) packet Take 17 g by mouth daily. Patient not taking: Reported on 10/09/2015 10/03/15   Rolly SalterPranav M Patel, MD  saccharomyces boulardii (FLORASTOR) 250 MG capsule Take 1 capsule (250 mg total) by mouth 2 (two) times daily. Patient not taking: Reported on 10/09/2015 10/03/15   Rolly SalterPranav M Patel, MD   SpO2 98% Physical Exam  Constitutional: He appears well-developed and well-nourished. No distress.  Awake, alert, nontoxic appearance  HENT:  Head: Normocephalic and atraumatic.  Mouth/Throat: Oropharynx is clear and moist. No oropharyngeal exudate.  Eyes: Conjunctivae are normal. No scleral icterus.  Neck: Normal range of motion. Neck supple.  Cardiovascular: Normal rate, regular rhythm and intact distal pulses.   Pulmonary/Chest: Effort normal. No respiratory distress.  Equal chest expansion  Abdominal: Soft. There is no tenderness.  Musculoskeletal: Normal range of motion. He exhibits edema.  Right ankle: anterior aspect with sutured wound, dehisced in the mid to lower aspect, superficially. Surrounding erythema of the anterior ankle, extending toward the posterior aspect, but no is no proximal streaking. Slightly decreased ROM, but no significant pain.  2+ pitting edema of the ankle and proximal forefoot.  Sensation is intact  Neurological: He is alert.  Speech is clear and goal oriented Moves extremities without ataxia  Skin: Skin is warm and dry. He is not  diaphoretic.  Psychiatric: He has a normal mood and affect.  Nursing note and vitals reviewed.   ED Course  Procedures (including critical care time)  DIAGNOSTIC STUDIES: Oxygen Saturation is 98% on RA, normal by my interpretation.      MDM   Final diagnoses:  Dehiscence of operative wound, initial encounter  Cellulitis of right lower leg   Mavrik Yett presents with persistently infected wound and new superficial dehiscence tonight. Pt has not been taking his outpatient antibiotics.  No evidence of spreading infection at this time; no proximal streaking.  Almost full ROM of the joint and no evidence of septic joint.  No trauma.  No indication for x-ray today.  At this time I believe pt is stable for continued outpatient treatment. He is to f/u with Dr. Lajoyce Cornersuda in 2-3 days and return to the ED for signs of progressing infection.    The patient was discussed with and seen by Dr. Effie ShyWentz who agrees with the treatment plan.   I personally performed the services described in this documentation, which was scribed in my presence. The recorded information has been reviewed and is accurate.   Dahlia ClientHannah Mccabe Gloria, PA-C 10/09/15 0102  Mancel BaleElliott Wentz,  MD 10/09/15 1403

## 2015-10-08 NOTE — ED Notes (Signed)
[facility] Expected date:  Expected time:  Means of arrival:  Comments: EMS post-op  problem

## 2015-10-08 NOTE — ED Notes (Signed)
Pt c/o foot pain and swelling post-op from a lower leg operation on 7/2.

## 2015-10-09 NOTE — ED Provider Notes (Signed)
Francis Clayton is a 50 y.o. male   Face-to-face evaluation   History: He presents for evaluation of pain and swelling in right foot, since surgery on 09/26/15. He is unaware of where he had the surgery or what it was. He seems intoxicated.  Physical exam: Alert, calm, cooperative. Right anterior ankle with sutured wound, dehisced in the mid to lower aspect, superficially. There is a small amount of associated dry blood and exudate on the region. There is indistinct erythema of the anterior ankle, extending toward the posterior aspect. There is no proximal streaking. There is no significant tenderness with passive range of motion of the right ankle.  Medical screening examination/treatment/procedure(s) were conducted as a shared visit with non-physician practitioner(s) and myself.  I personally evaluated the patient during the encounter  Mancel BaleElliott Brigett Estell, MD 10/09/15 1404

## 2015-10-09 NOTE — Discharge Instructions (Signed)
1. Medications: return to taking your clindamycin, usual home medications 2. Treatment: rest, drink plenty of fluids, elevate the leg, keep wound clean and dry, take your antibiotics as directed 3. Follow Up: Please followup with Dr. Lajoyce Cornersuda in 2-3 days for discussion of your diagnoses and further evaluation after today's visit; if you do not have a primary care doctor use the resource guide provided to find one; Please return to the ER for fevers, chills, worsening redness, nausea or vomiting

## 2017-01-18 DIAGNOSIS — K661 Hemoperitoneum: Secondary | ICD-10-CM

## 2017-01-18 DIAGNOSIS — S36039A Unspecified laceration of spleen, initial encounter: Secondary | ICD-10-CM

## 2017-01-18 HISTORY — DX: Unspecified laceration of spleen, initial encounter: S36.039A

## 2017-01-18 HISTORY — DX: Hemoperitoneum: K66.1

## 2017-01-19 DIAGNOSIS — S2232XA Fracture of one rib, left side, initial encounter for closed fracture: Secondary | ICD-10-CM | POA: Insufficient documentation

## 2017-01-19 HISTORY — DX: Fracture of one rib, left side, initial encounter for closed fracture: S22.32XA

## 2017-10-06 ENCOUNTER — Encounter (HOSPITAL_COMMUNITY): Payer: Self-pay | Admitting: Emergency Medicine

## 2017-10-06 ENCOUNTER — Emergency Department (HOSPITAL_COMMUNITY): Payer: Self-pay

## 2017-10-06 ENCOUNTER — Emergency Department (HOSPITAL_COMMUNITY)
Admission: EM | Admit: 2017-10-06 | Discharge: 2017-10-06 | Disposition: A | Discharge status: Home / Self Care | Payer: Self-pay | Attending: Emergency Medicine | Admitting: Emergency Medicine

## 2017-10-06 DIAGNOSIS — F17210 Nicotine dependence, cigarettes, uncomplicated: Secondary | ICD-10-CM | POA: Insufficient documentation

## 2017-10-06 DIAGNOSIS — R112 Nausea with vomiting, unspecified: Secondary | ICD-10-CM | POA: Insufficient documentation

## 2017-10-06 DIAGNOSIS — R0789 Other chest pain: Secondary | ICD-10-CM | POA: Insufficient documentation

## 2017-10-06 DIAGNOSIS — J449 Chronic obstructive pulmonary disease, unspecified: Secondary | ICD-10-CM | POA: Insufficient documentation

## 2017-10-06 LAB — CBC
HEMATOCRIT: 46.2 % (ref 39.0–52.0)
HEMOGLOBIN: 15.8 g/dL (ref 13.0–17.0)
MCH: 30.8 pg (ref 26.0–34.0)
MCHC: 34.2 g/dL (ref 30.0–36.0)
MCV: 90.1 fL (ref 78.0–100.0)
PLATELETS: 404 10*3/uL — AB (ref 150–400)
RBC: 5.13 MIL/uL (ref 4.22–5.81)
RDW: 13.9 % (ref 11.5–15.5)
WBC: 11.9 10*3/uL — AB (ref 4.0–10.5)

## 2017-10-06 LAB — LIPASE, BLOOD: Lipase: 30 U/L (ref 11–51)

## 2017-10-06 LAB — HEPATIC FUNCTION PANEL
ALT: 17 U/L (ref 0–44)
AST: 20 U/L (ref 15–41)
Albumin: 3.9 g/dL (ref 3.5–5.0)
Alkaline Phosphatase: 77 U/L (ref 38–126)
Bilirubin, Direct: 0.2 mg/dL (ref 0.0–0.2)
Indirect Bilirubin: 0.6 mg/dL (ref 0.3–0.9)
Total Bilirubin: 0.8 mg/dL (ref 0.3–1.2)
Total Protein: 6.4 g/dL — ABNORMAL LOW (ref 6.5–8.1)

## 2017-10-06 LAB — BASIC METABOLIC PANEL
ANION GAP: 14 (ref 5–15)
BUN: 8 mg/dL (ref 6–20)
CHLORIDE: 100 mmol/L (ref 98–111)
CO2: 22 mmol/L (ref 22–32)
Calcium: 8.9 mg/dL (ref 8.9–10.3)
Creatinine, Ser: 0.91 mg/dL (ref 0.61–1.24)
GFR calc non Af Amer: >60 mL/min (ref >60)
Glucose, Bld: 109 mg/dL — ABNORMAL HIGH (ref 70–99)
POTASSIUM: 3.5 mmol/L (ref 3.5–5.1)
SODIUM: 136 mmol/L (ref 135–145)

## 2017-10-06 LAB — I-STAT TROPONIN, ED
Troponin i, poc: 0 ng/mL (ref 0.00–0.08)
Troponin i, poc: 0 ng/mL (ref 0.00–0.08)
Troponin i, poc: 0 ng/mL (ref 0.00–0.08)

## 2017-10-06 MED ORDER — GI COCKTAIL ~~LOC~~
30.0000 mL | Freq: Once | ORAL | Status: AC
  Administered 2017-10-06: 30 mL via ORAL
  Filled 2017-10-06: qty 30

## 2017-10-06 MED ORDER — ONDANSETRON 4 MG PO TBDP: 4.0000 mg | ORAL_TABLET | Freq: Three times a day (TID) | ORAL | 0 refills | Status: AC | PRN

## 2017-10-06 MED ORDER — FAMOTIDINE IN NACL 20-0.9 MG/50ML-% IV SOLN
20.0000 mg | Freq: Once | INTRAVENOUS | Status: AC
  Administered 2017-10-06: 20 mg via INTRAVENOUS
  Filled 2017-10-06: qty 50

## 2017-10-06 MED ORDER — RANITIDINE HCL 150 MG PO TABS: 150.0000 mg | ORAL_TABLET | Freq: Two times a day (BID) | ORAL | 0 refills | Status: DC

## 2017-10-06 MED ORDER — ONDANSETRON HCL 4 MG/2ML IJ SOLN
4.0000 mg | Freq: Once | INTRAMUSCULAR | Status: AC
  Administered 2017-10-06: 4 mg via INTRAVENOUS
  Filled 2017-10-06: qty 2

## 2017-10-06 NOTE — Discharge Instructions (Addendum)
1. Medications: Take Zofran as needed for nausea.  Wait around 20 minutes before eating or drinking after taking this medication.  Start taking Zantac twice daily. 2. Treatment: rest, drink plenty of fluids, advance diet slowly.  Start with water and broth then advance to bland foods that will not upset your stomach such as crackers, mashed potatoes, and peanut butter. 3. Follow Up: Call [facility] on Monday to set up a follow-up appointment.  Tell them you were referred from the emergency department.  Please return to the ER for persistent vomiting, high fevers, ongoing or worsening chest pain, shortness of breath, or worsening symptoms

## 2017-10-06 NOTE — ED Triage Notes (Addendum)
Pt reports he went to a cookout earlier in the night, states once he got home he began having severe burning in his chest and throat and one episode of vomiting, tried to lay down and was unable to due to pain. Also having hiccups, denies any alcohol intake. Pt a/ox4, resp e/u, nad.

## 2017-10-06 NOTE — ED Provider Notes (Signed)
[facility] EMERGENCY DEPARTMENT Provider Note   CSN: [id number] Arrival date & time: 10/06/17  2956     History   Chief Complaint Chief Complaint  Patient presents with  . Chest Pain  . Emesis    HPI Francis Clayton is a 52 y.o. male with history of tobacco abuse presents for evaluation of acute onset, persisting burning chest pain.  He states that yesterday evening he went to a cookout in which he had a hot dog which he normally does not have.  Denies drinking alcohol.  States that at around 1:30 AM he had sudden onset of burning in his chest which is midline but radiated "all over the chest "and up to the jaw.  He did have an episode of shortness of breath at one point but this has resolved.  He does endorse associated nausea and has had multiple episodes of emesis.  Unsure if bilious or bloody.  Endorses lightheadedness but denies syncope.  He has had hiccuping since his symptoms began and endorses mild discomfort associated with this but denies any significant abdominal pain.  He is a current smoker of approximately a pack and a half of cigarettes daily, denies recreational drug use and states that he drinks alcohol rarely.  He has not been seen and evaluated by primary care physician in several years.  The history is provided by the patient.    Past Medical History:  Diagnosis Date  . COPD (chronic obstructive pulmonary disease) (HCC)   . Gout   . Hernia, abdominal   . Tobacco abuse     Patient Active Problem List   Diagnosis Date Noted  . Cellulitis of right lower leg 09/29/2015  . Sepsis (HCC) 09/27/2015  . Cellulitis of right ankle 09/27/2015  . COPD (chronic obstructive pulmonary disease) (HCC)   . Tobacco abuse     Past Surgical History:  Procedure Laterality Date  . HERNIA REPAIR    . I&D EXTREMITY Right 10/01/2015   Procedure: IRRIGATION AND DEBRIDEMENT EXTREMITY;  Surgeon: Nadara Mustard, MD;  Location: [facility] OR;  Service: Orthopedics;  Laterality:  Right;  . SHOULDER SURGERY          Home Medications    Prior to Admission medications   Medication Sig Start Date End Date Taking? Authorizing Provider  acetaminophen (TYLENOL) 325 MG tablet Take 2 tablets (650 mg total) by mouth every 6 (six) hours as needed for mild pain (or Fever >/= 101). Patient not taking: Reported on 10/09/2015 10/03/15   Rolly Salter, MD  HYDROcodone-acetaminophen [facility]) 5-325 MG tablet Take 1 tablet by mouth every 6 (six) hours as needed for moderate pain. Patient not taking: Reported on 10/09/2015 10/03/15   Rolly Salter, MD  methocarbamol (ROBAXIN) 500 MG tablet Take 1 tablet (500 mg total) by mouth every 6 (six) hours as needed for muscle spasms. Patient not taking: Reported on 10/09/2015 10/03/15   Rolly Salter, MD  nicotine (NICODERM CQ - DOSED IN MG/24 HOURS) 21 mg/24hr patch Place 1 patch (21 mg total) onto the skin daily. Patient not taking: Reported on 10/09/2015 10/03/15   Rolly Salter, MD  ondansetron (ZOFRAN ODT) 4 MG disintegrating tablet Take 1 tablet (4 mg total) by mouth every 8 (eight) hours as needed for up to 3 days for nausea or vomiting. 10/06/17 10/09/17  Michela Pitcher A, PA-C  polyethylene glycol (MIRALAX / GLYCOLAX) packet Take 17 g by mouth daily. Patient not taking: Reported on 10/09/2015 10/03/15  Rolly Salter, MD  ranitidine (ZANTAC) 150 MG tablet Take 1 tablet (150 mg total) by mouth 2 (two) times daily. 10/06/17   Kyarra Vancamp A, PA-C  saccharomyces boulardii (FLORASTOR) 250 MG capsule Take 1 capsule (250 mg total) by mouth 2 (two) times daily. Patient not taking: Reported on 10/09/2015 10/03/15   Rolly Salter, MD    Family History Family History  Problem Relation Age of Onset  . Cancer Mother   . Parkinson's disease Mother   . Emphysema Father     Social History Social History   Tobacco Use  . Smoking status: Current Every Day Smoker    Types: Cigarettes  Substance Use Topics  . Alcohol use: No  . Drug use: No      Allergies   Patient has no known allergies.   Review of Systems Review of Systems  Constitutional: Negative for chills and fever.  Respiratory: Positive for shortness of breath.   Cardiovascular: Positive for chest pain.  Gastrointestinal: Positive for nausea and vomiting.  Neurological: Positive for light-headedness. Negative for syncope.  All other systems reviewed and are negative.    Physical Exam Updated Vital Signs BP 107/85   Pulse 86   Temp 97.8 F (36.6 C) (Oral)   Resp 13   SpO2 93%   Physical Exam  Constitutional: He appears well-developed and well-nourished. No distress.  Resting comfortably in bed  HENT:  Head: Normocephalic and atraumatic.  Eyes: Conjunctivae are normal. Right eye exhibits no discharge. Left eye exhibits no discharge.  Neck: No JVD present. No tracheal deviation present.  Cardiovascular: Normal rate and regular rhythm.  Pulses:      Carotid pulses are 2+ on the right side, and 2+ on the left side.      Radial pulses are 2+ on the right side, and 2+ on the left side.       Dorsalis pedis pulses are 2+ on the right side, and 2+ on the left side.       Posterior tibial pulses are 2+ on the right side, and 2+ on the left side.  Homans sign absent bilaterally, no lower extremity edema, no palpable cords, compartments are soft   Pulmonary/Chest: Effort normal and breath sounds normal. No accessory muscle usage. No tachypnea.  Equal rise and fall of chest, no increased work of breathing.  No tenderness to palpation of the chest wall.  Speaking in full sentences without difficulty  Abdominal: Soft. He exhibits no distension. There is no tenderness. There is no guarding.  Hypoactive bowel sounds in all 4 quadrants.  Murphy sign absent.  No CVA tenderness.  Musculoskeletal: He exhibits no edema.       Right lower leg: Normal. He exhibits no tenderness and no edema.       Left lower leg: Normal. He exhibits no tenderness and no edema.   Neurological: He is alert.  Skin: Skin is warm and dry. No erythema.  Psychiatric: He has a normal mood and affect. His behavior is normal.  Nursing note and vitals reviewed.    ED Treatments / Results  Labs (all labs ordered are listed, but only abnormal results are displayed) Labs Reviewed  BASIC METABOLIC PANEL - Abnormal; Notable for the following components:      Result Value   Glucose, Bld 109 (*)    All other components within normal limits  CBC - Abnormal; Notable for the following components:   WBC 11.9 (*)    Platelets 404 (*)  All other components within normal limits  HEPATIC FUNCTION PANEL - Abnormal; Notable for the following components:   Total Protein 6.4 (*)    All other components within normal limits  LIPASE, BLOOD  I-STAT TROPONIN, ED  I-STAT TROPONIN, ED  I-STAT TROPONIN, ED    EKG EKG Interpretation  Date/Time:  Saturday October 06 2017 05:19:52 EDT Ventricular Rate:  82 PR Interval:  138 QRS Duration: 98 QT Interval:  394 QTC Calculation: 460 R Axis:   14 Text Interpretation:  Normal sinus rhythm Borderline Left Axis Deviation Normal ECG Confirmed by Geoffery Lyons ([id number]) on 10/06/2017 7:14:36 AM   Radiology Dg Chest 2 View  Result Date: 10/06/2017 CLINICAL DATA:  Chest pressure and burning sensation. EXAM: CHEST - 2 VIEW COMPARISON:  None. FINDINGS: The heart size and mediastinal contours are within normal limits. Both lungs are clear. Old left seventh rib fracture. Lungs are hyperinflated. IMPRESSION: COPD without acute airspace disease. Electronically Signed   By: Deatra Robinson M.D.   On: 10/06/2017 06:07    Procedures Procedures (including critical care time)  Medications Ordered in ED Medications  gi cocktail (Maalox,Lidocaine,Donnatal) (30 mLs Oral Given 10/06/17 0738)  famotidine (PEPCID) IVPB 20 mg premix (0 mg Intravenous Stopped 10/06/17 1024)  ondansetron (ZOFRAN) injection 4 mg (4 mg Intravenous Given 10/06/17 0738)     Initial  Impression / Assessment and Plan / ED Course  I have reviewed the triage vital signs and the nursing notes.  Pertinent labs & imaging results that were available during my care of the patient were reviewed by me and considered in my medical decision making (see chart for details).     Patient presents with acute onset, constant burning of the chest with associated nausea and vomiting after a cookout yesterday.  He is afebrile, vital signs are stable.  He is nontoxic in appearance.  Chest pain is not exertional, pleuritic, not reproducible on palpation.  He did have some discomfort with hiccuping but no significant abdominal pain and no tenderness to palpation of the abdomen.  No peritoneal signs on examination of the abdomen.  Lab work reviewed by me shows mild nonspecific leukocytosis, possibly related to his vomiting.  No anemia, no metabolic derangements.  LFTs, lipase, and creatinine are all within normal limits.  EKG shows normal sinus rhythm with no ischemic changes or arrhythmia.  Serial troponins are negative.  His HEART score is 3, symptoms do not appear to be entirely cardiac in nature.  Chest x-ray shows no acute cardiopulmonary abnormalities although is consistent with COPD.  His lungs are clear on auscultation today.  He has no increased work of breathing.  Symptoms improved with GI cocktail and Pepcid.  On reevaluation he is tolerating p.o. food and fluids without difficulty, no vomiting while in the ED.  No evidence of pericarditis, myocarditis, pneumonia, or pleural effusion.  I doubt PE in the absence of risk factors and without persisting shortness of breath, hypoxia, or tachycardia.  No evidence of pancreatitis, cholecystitis, or acute surgical abdominal pathology.  He is stable for discharge home with follow-up with the PCP for reevaluation of his symptoms.  Will discharge with Zofran and Zantac, instructions to advance diet slowly.  Discussed strict ED return precautions. Pt verbalized  understanding of and agreement with plan and is safe for discharge home at this time.  Patient seen and evaluated by Dr. Rosalia Hammers who agrees with assessment and plan at this time.  Final Clinical Impressions(s) / ED Diagnoses   Final diagnoses:  Atypical chest pain  Nausea and vomiting in adult patient    ED Discharge Orders        Ordered    ondansetron (ZOFRAN ODT) 4 MG disintegrating tablet  Every 8 hours PRN     10/06/17 1028    ranitidine (ZANTAC) 150 MG tablet  2 times daily     10/06/17 1028       Adrina Armijo, [place] Vista[place] A, PA-C 10/06/17 1032    Margarita Grizzleay, Danielle, MD 10/06/17 1201

## 2018-08-09 ENCOUNTER — Emergency Department (HOSPITAL_COMMUNITY): Payer: Self-pay

## 2018-08-09 ENCOUNTER — Encounter (HOSPITAL_COMMUNITY): Payer: Self-pay | Admitting: Emergency Medicine

## 2018-08-09 ENCOUNTER — Emergency Department (HOSPITAL_COMMUNITY)
Admission: EM | Admit: 2018-08-09 | Discharge: 2018-08-09 | Disposition: A | Discharge status: Home / Self Care | Payer: Self-pay | Attending: Emergency Medicine | Admitting: Emergency Medicine

## 2018-08-09 ENCOUNTER — Other Ambulatory Visit: Payer: Self-pay

## 2018-08-09 DIAGNOSIS — L03031 Cellulitis of right toe: Secondary | ICD-10-CM | POA: Insufficient documentation

## 2018-08-09 DIAGNOSIS — J449 Chronic obstructive pulmonary disease, unspecified: Secondary | ICD-10-CM | POA: Insufficient documentation

## 2018-08-09 DIAGNOSIS — F17210 Nicotine dependence, cigarettes, uncomplicated: Secondary | ICD-10-CM | POA: Insufficient documentation

## 2018-08-09 DIAGNOSIS — M109 Gout, unspecified: Secondary | ICD-10-CM | POA: Insufficient documentation

## 2018-08-09 LAB — CBC WITH DIFFERENTIAL/PLATELET
Abs Immature Granulocytes: 0.04 10*3/uL (ref 0.00–0.07)
Basophils Absolute: 0 10*3/uL (ref 0.0–0.1)
Basophils Relative: 0 %
Eosinophils Absolute: 0.4 10*3/uL (ref 0.0–0.5)
Eosinophils Relative: 3 %
HCT: 46.2 % (ref 39.0–52.0)
Hemoglobin: 15.4 g/dL (ref 13.0–17.0)
Immature Granulocytes: 0 %
Lymphocytes Relative: 20 %
Lymphs Abs: 2.4 10*3/uL (ref 0.7–4.0)
MCH: 31.3 pg (ref 26.0–34.0)
MCHC: 33.3 g/dL (ref 30.0–36.0)
MCV: 93.9 fL (ref 80.0–100.0)
Monocytes Absolute: 1.2 10*3/uL — ABNORMAL HIGH (ref 0.1–1.0)
Monocytes Relative: 10 %
Neutro Abs: 7.7 10*3/uL (ref 1.7–7.7)
Neutrophils Relative %: 67 %
Platelets: 362 10*3/uL (ref 150–400)
RBC: 4.92 MIL/uL (ref 4.22–5.81)
RDW: 13.2 % (ref 11.5–15.5)
WBC: 11.7 10*3/uL — ABNORMAL HIGH (ref 4.0–10.5)
nRBC: 0 % (ref 0.0–0.2)

## 2018-08-09 MED ORDER — DOXYCYCLINE HYCLATE 100 MG PO CAPS: 100.0000 mg | ORAL_CAPSULE | Freq: Two times a day (BID) | ORAL | 0 refills | Status: DC

## 2018-08-09 MED ORDER — SODIUM CHLORIDE 0.9% FLUSH: 3.0000 mL | Freq: Once | INTRAVENOUS | Status: DC

## 2018-08-09 MED ORDER — HYDROCODONE-ACETAMINOPHEN 5-325 MG PO TABS
1.0000 | ORAL_TABLET | Freq: Once | ORAL | Status: AC
  Administered 2018-08-09: 19:00:00 1 via ORAL
  Filled 2018-08-09: qty 1

## 2018-08-09 MED ORDER — IBUPROFEN 400 MG PO TABS
600.0000 mg | ORAL_TABLET | Freq: Once | ORAL | Status: AC
  Administered 2018-08-09: 600 mg via ORAL
  Filled 2018-08-09: qty 1

## 2018-08-09 MED ORDER — DOXYCYCLINE HYCLATE 100 MG PO TABS
100.0000 mg | ORAL_TABLET | Freq: Once | ORAL | Status: AC
  Administered 2018-08-09: 20:00:00 100 mg via ORAL
  Filled 2018-08-09: qty 1

## 2018-08-09 NOTE — ED Triage Notes (Signed)
Patient reports worsening blisters in between R big and second toes - noticed a lot of purulent drainage this morning with swelling and redness. States similar happened before to same foot and he needed to be admitted for IV antibiotics. Denies fevers/chills. Ambulatory in triage.

## 2018-08-09 NOTE — ED Provider Notes (Signed)
[facility] EMERGENCY DEPARTMENT Provider Note   CSN: [id number] Arrival date & time: 08/09/18  1608    History   Chief Complaint Chief Complaint  Patient presents with  . Toe Infection    HPI Francis Clayton is a 53 y.o. male.     HPI Patient is a 53 year old male presents to the emergency department with complaints of pain and redness to the right second toe.  Reports some purulent drainage today.  He states a history of recurrent infection in his toe.  He is required IV antibiotics before in the past.  No fevers.  No spreading redness.  Redness is focused in the right second toe.  He is concerned about the possibility of infection within the bone itself.  No other complaints.  Pain is moderate in severity    Past Medical History:  Diagnosis Date  . COPD (chronic obstructive pulmonary disease) (HCC)   . Gout   . Hernia, abdominal   . Tobacco abuse     Patient Active Problem List   Diagnosis Date Noted  . Cellulitis of right lower leg 09/29/2015  . Sepsis (HCC) 09/27/2015  . Cellulitis of right ankle 09/27/2015  . COPD (chronic obstructive pulmonary disease) (HCC)   . Tobacco abuse     Past Surgical History:  Procedure Laterality Date  . HERNIA REPAIR    . I&D EXTREMITY Right 10/01/2015   Procedure: IRRIGATION AND DEBRIDEMENT EXTREMITY;  Surgeon: Nadara Mustard, MD;  Location: [facility] OR;  Service: Orthopedics;  Laterality: Right;  . SHOULDER SURGERY          Home Medications    Prior to Admission medications   Medication Sig Start Date End Date Taking? Authorizing Provider  acetaminophen (TYLENOL) 325 MG tablet Take 2 tablets (650 mg total) by mouth every 6 (six) hours as needed for mild pain (or Fever >/= 101). Patient not taking: Reported on 10/09/2015 10/03/15   Rolly Salter, MD  doxycycline (VIBRAMYCIN) 100 MG capsule Take 1 capsule (100 mg total) by mouth 2 (two) times daily. 08/09/18   Azalia Bilis, MD  HYDROcodone-acetaminophen (NORCO) 5-325  MG tablet Take 1 tablet by mouth every 6 (six) hours as needed for moderate pain. Patient not taking: Reported on 10/09/2015 10/03/15   Rolly Salter, MD  methocarbamol (ROBAXIN) 500 MG tablet Take 1 tablet (500 mg total) by mouth every 6 (six) hours as needed for muscle spasms. Patient not taking: Reported on 10/09/2015 10/03/15   Rolly Salter, MD  nicotine (NICODERM CQ - DOSED IN MG/24 HOURS) 21 mg/24hr patch Place 1 patch (21 mg total) onto the skin daily. Patient not taking: Reported on 10/09/2015 10/03/15   Rolly Salter, MD  polyethylene glycol [facility] / Ethelene Hal) packet Take 17 g by mouth daily. Patient not taking: Reported on 10/09/2015 10/03/15   Rolly Salter, MD  ranitidine (ZANTAC) 150 MG tablet Take 1 tablet (150 mg total) by mouth 2 (two) times daily. 10/06/17   Fawze, Mina A, PA-C  saccharomyces boulardii (FLORASTOR) 250 MG capsule Take 1 capsule (250 mg total) by mouth 2 (two) times daily. Patient not taking: Reported on 10/09/2015 10/03/15   Rolly Salter, MD    Family History Family History  Problem Relation Age of Onset  . Cancer Mother   . Parkinson's disease Mother   . Emphysema Father     Social History Social History   Tobacco Use  . Smoking status: Current Every Day Smoker    Types:  Cigarettes  Substance Use Topics  . Alcohol use: No  . Drug use: No     Allergies   Patient has no known allergies.   Review of Systems Review of Systems  All other systems reviewed and are negative.    Physical Exam Updated Vital Signs BP 105/89 (BP Location: Right Arm)   Pulse 70   Temp 98.4 F (36.9 C) (Oral)   Resp 16   Ht 6' 4.5" (1.943 m)   Wt 83.9 kg   SpO2 97%   BMI 22.23 kg/m   Physical Exam Vitals signs and nursing note reviewed.  Constitutional:      Appearance: He is well-developed.  HENT:     Head: Normocephalic and atraumatic.  Neck:     Musculoskeletal: Normal range of motion.  Cardiovascular:     Rate and Rhythm: Normal rate and regular  rhythm.     Heart sounds: Normal heart sounds.  Pulmonary:     Effort: Pulmonary effort is normal. No respiratory distress.     Breath sounds: Normal breath sounds.  Abdominal:     General: There is no distension.     Palpations: Abdomen is soft.     Tenderness: There is no abdominal tenderness.  Musculoskeletal: Normal range of motion.     Comments: Erythema of the right second toe without purulent drainage.  Blister on the medial aspect.  Normal pulses right foot.  Compartments are soft and right foot.  No spreading of erythema onto the dorsum of the right foot.  Skin:    General: Skin is warm and dry.  Neurological:     Mental Status: He is alert and oriented to person, place, and time.  Psychiatric:        Judgment: Judgment normal.      ED Treatments / Results  Labs (all labs ordered are listed, but only abnormal results are displayed) Labs Reviewed  CBC WITH DIFFERENTIAL/PLATELET - Abnormal; Notable for the following components:      Result Value   WBC 11.7 (*)    Monocytes Absolute 1.2 (*)    All other components within normal limits    EKG None  Radiology Dg Toe 2nd Right  Result Date: 08/09/2018 CLINICAL DATA:  Redness and draining blisters right second toe. Known injury. EXAM: RIGHT SECOND TOE COMPARISON:  Plain films right foot 09/27/2015. FINDINGS: No bony destructive change or periosteal reaction is identified. No fracture or dislocation. Soft tissues of the second toe appear swollen. No soft tissue gas or radiopaque foreign body. Advanced first MTP osteoarthritis is noted. IMPRESSION: Soft tissue swelling. Negative for plain film evidence of osteomyelitis. Electronically Signed   By: Drusilla Kanner M.D.   On: 08/09/2018 20:12    Procedures Procedures (including critical care time)  Medications Ordered in ED Medications  ibuprofen (ADVIL) tablet 600 mg (600 mg Oral Given 08/09/18 1852)  HYDROcodone-acetaminophen (NORCO/VICODIN) 5-325 MG per tablet 1 tablet  (1 tablet Oral Given 08/09/18 1852)  doxycycline (VIBRA-TABS) tablet 100 mg (100 mg Oral Given 08/09/18 2012)     Initial Impression / Assessment and Plan / ED Course  I have reviewed the triage vital signs and the nursing notes.  Pertinent labs & imaging results that were available during my care of the patient were reviewed by me and considered in my medical decision making (see chart for details).        Cellulitis right second toe.  No drainage at this time.  Plain films demonstrate no evidence of osteomyelitis.  Overall well-appearing.  Home with Doxy.  Primary care follow-up.  Patient understands return the emergency department for new or worsening symptoms  Final Clinical Impressions(s) / ED Diagnoses   Final diagnoses:  Cellulitis of right toe    ED Discharge Orders         Ordered    doxycycline (VIBRAMYCIN) 100 MG capsule  2 times daily     08/09/18 2023           Azalia Bilisampos, Mustafa Potts, MD 08/09/18 2025

## 2019-05-21 DIAGNOSIS — B159 Hepatitis A without hepatic coma: Secondary | ICD-10-CM

## 2019-05-21 HISTORY — DX: Hepatitis a without hepatic coma: B15.9

## 2019-11-12 ENCOUNTER — Encounter: Payer: Self-pay | Admitting: Gerontology

## 2019-11-12 ENCOUNTER — Other Ambulatory Visit: Payer: Self-pay

## 2019-11-12 ENCOUNTER — Ambulatory Visit: Payer: Self-pay | Admitting: Gerontology

## 2019-11-12 VITALS — BP 98/61 | HR 73 | Ht 76.0 in | Wt 199.7 lb

## 2019-11-12 DIAGNOSIS — Z8709 Personal history of other diseases of the respiratory system: Secondary | ICD-10-CM

## 2019-11-12 DIAGNOSIS — Z8719 Personal history of other diseases of the digestive system: Secondary | ICD-10-CM

## 2019-11-12 DIAGNOSIS — K0381 Cracked tooth: Secondary | ICD-10-CM

## 2019-11-12 DIAGNOSIS — M79671 Pain in right foot: Secondary | ICD-10-CM

## 2019-11-12 DIAGNOSIS — Z7689 Persons encountering health services in other specified circumstances: Secondary | ICD-10-CM | POA: Insufficient documentation

## 2019-11-12 DIAGNOSIS — Z720 Tobacco use: Secondary | ICD-10-CM

## 2019-11-12 HISTORY — DX: Pain in right foot: M79.671

## 2019-11-12 HISTORY — DX: Cracked tooth: K03.81

## 2019-11-12 HISTORY — DX: Persons encountering health services in other specified circumstances: Z76.89

## 2019-11-12 HISTORY — DX: Personal history of other diseases of the respiratory system: Z87.09

## 2019-11-12 HISTORY — DX: Personal history of other diseases of the digestive system: Z87.19

## 2019-11-12 MED ORDER — ALBUTEROL SULFATE HFA 108 (90 BASE) MCG/ACT IN AERS: 1.0000 | INHALATION_SPRAY | Freq: Four times a day (QID) | RESPIRATORY_TRACT | 2 refills | Status: DC | PRN

## 2019-11-12 MED ORDER — MELOXICAM 7.5 MG PO TABS: 7.5000 mg | ORAL_TABLET | Freq: Every day | ORAL | 0 refills | Status: DC

## 2019-11-12 MED ORDER — PANTOPRAZOLE SODIUM 40 MG PO TBEC: 40.0000 mg | DELAYED_RELEASE_TABLET | Freq: Every day | ORAL | 2 refills | Status: DC

## 2019-11-12 MED ORDER — BREO ELLIPTA 100-25 MCG/INH IN AEPB: 1.0000 | INHALATION_SPRAY | Freq: Every day | RESPIRATORY_TRACT | 2 refills | Status: DC

## 2019-11-12 NOTE — Patient Instructions (Signed)

## 2019-11-12 NOTE — Progress Notes (Signed)
Patient ID: Marlena Clipper, male   DOB: 11-05-1965, 54 y.o.   MRN: [id number]  No chief complaint on file.   HPI Massey Delval is a 54 y.o. male who presents to establish care and evaluation of his chronic condition. He has a history of COPD , and reports that his breathing is stable, he uses albuterol as needed and Breo Ellipta. He states that he smokes 1/2 pack of cigarette daily and admits the desire to quit. He also has a history of GERD and takes 40 mg Protonix daily with relief. He states that he resides at [facility]. He reports that he stepped on a stone in June and his right heel was bruised and was seen at [facility] ED on 6/11/2021and bacitracin was recommended. He states that he continues to experience intermittent pain when he puts his right heel on the floor from a sitting or lying position.He states that it's been going on for 3 months. He states that wearing the new shoe he had on relieves his heel pain when he's walking. He states that elevating legs and not putting pressure on his right heel relieves his symptoms.  He also c/o crack tooth to his right upper and lower mandibular and request Dental referral. Overall, he states that he's doing well and offers no further complaint.   Past Medical History:  Diagnosis Date   COPD (chronic obstructive pulmonary disease) (HCC)    Gout    Hernia, abdominal    Tobacco abuse     Past Surgical History:  Procedure Laterality Date   HERNIA REPAIR     I & D EXTREMITY Right 10/01/2015   Procedure: IRRIGATION AND DEBRIDEMENT EXTREMITY;  Surgeon: Nadara Mustard, MD;  Location: [facility] OR;  Service: Orthopedics;  Laterality: Right;   SHOULDER SURGERY      Family History  Problem Relation Age of Onset   Cancer Mother    Parkinson's disease Mother    Emphysema Father     Social History Social History   Tobacco Use   Smoking status: Current Every Day Smoker    Types: Cigarettes  Substance Use Topics   Alcohol use: No    Drug use: No    No Known Allergies  Current Outpatient Medications  Medication Sig Dispense Refill   acetaminophen (TYLENOL) 325 MG tablet Take 2 tablets (650 mg total) by mouth every 6 (six) hours as needed for mild pain (or Fever >/= 101). (Patient not taking: Reported on 10/09/2015) 60 tablet 0   doxycycline (VIBRAMYCIN) 100 MG capsule Take 1 capsule (100 mg total) by mouth 2 (two) times daily. 20 capsule 0   HYDROcodone-acetaminophen (NORCO) 5-325 MG tablet Take 1 tablet by mouth every 6 (six) hours as needed for moderate pain. (Patient not taking: Reported on 10/09/2015) 20 tablet 0   methocarbamol (ROBAXIN) 500 MG tablet Take 1 tablet (500 mg total) by mouth every 6 (six) hours as needed for muscle spasms. (Patient not taking: Reported on 10/09/2015) 30 tablet 0   nicotine (NICODERM CQ - DOSED IN MG/24 HOURS) 21 mg/24hr patch Place 1 patch (21 mg total) onto the skin daily. (Patient not taking: Reported on 10/09/2015) 28 patch 0   polyethylene glycol (MIRALAX / GLYCOLAX) packet Take 17 g by mouth daily. (Patient not taking: Reported on 10/09/2015) 14 each 0   ranitidine (ZANTAC) 150 MG tablet Take 1 tablet (150 mg total) by mouth 2 (two) times daily. 60 tablet 0   saccharomyces boulardii (FLORASTOR) 250 MG capsule Take  1 capsule (250 mg total) by mouth 2 (two) times daily. (Patient not taking: Reported on 10/09/2015) 30 capsule 0   No current facility-administered medications for this visit.    Review of Systems Review of Systems  Constitutional: Negative.   HENT: Positive for dental problem.   Eyes: Negative.   Respiratory: Negative.   Cardiovascular: Negative.   Gastrointestinal: Negative.   Endocrine: Negative.   Genitourinary: Negative.   Musculoskeletal: Positive for arthralgias (right heel pain).  Skin: Negative.   Neurological: Negative.   Hematological: Negative.   Psychiatric/Behavioral: Negative.     There were no vitals taken for this visit.  Physical  Exam Physical Exam HENT:     Head: Normocephalic and atraumatic.     Nose:     Comments: Deferred per Covid Protocol    Mouth/Throat:   Eyes:     Extraocular Movements: Extraocular movements intact.     Conjunctiva/sclera: Conjunctivae normal.     Pupils: Pupils are equal, round, and reactive to light.  Cardiovascular:     Rate and Rhythm: Normal rate and regular rhythm.     Pulses: Normal pulses.     Heart sounds: Normal heart sounds.  Pulmonary:     Effort: Pulmonary effort is normal.     Breath sounds: Normal breath sounds.  Abdominal:     General: Abdomen is flat. Bowel sounds are normal.     Palpations: Abdomen is soft.  Genitourinary:    Comments: Deferred per patient Musculoskeletal:        General: Normal range of motion.     Cervical back: Normal range of motion.  Skin:    General: Skin is warm and dry.  Neurological:     General: No focal deficit present.     Mental Status: He is alert and oriented to person, place, and time. Mental status is at baseline.  Psychiatric:        Mood and Affect: Mood normal.        Behavior: Behavior normal.        Thought Content: Thought content normal.     Data Reviewed -Lab and past medical history was reviewed.  Assessment and Plan   1. Encounter to establish care - Routine labs will be checked. - CBC w/Diff; Future - Comp Met (CMET); Future - Lipid panel; Future - HgB A1c; Future - Urinalysis; Future - Urinalysis - HgB A1c - Lipid panel - Comp Met (CMET) - CBC w/Diff  2. History of COPD - His COPD is stable and he will continue on current treatment regimen. - albuterol (VENTOLIN HFA) 108 (90 Base) MCG/ACT inhaler; Inhale 1-2 puffs into the lungs every 6 (six) hours as needed for wheezing or shortness of breath.  Dispense: 18 g; Refill: 2 - fluticasone furoate-vilanterol (BREO ELLIPTA) 100-25 MCG/INH AEPB; Inhale 1 puff into the lungs daily.  Dispense: 1 each; Refill: 2  3. History of gastroesophageal reflux  (GERD) - His GERD is under control and he will continue on current treatment regimen. He Deferred Colonoscopy pending [employer] application approval. - pantoprazole (PROTONIX) 40 MG tablet; Take 1 tablet (40 mg total) by mouth daily.  Dispense: 30 tablet; Refill: 2  4. Tobacco abuse - He was encouraged on smoking cessation and was provided with [place] Quitline number.  5. Pain of right heel - Ddx Plantar phacitis or Bone spur. He will continue on Meloxicam, was educated on medication side effects and advised to notify clinic. He will follow up with [facility] Orthopedic surgeon. -  meloxicam (MOBIC) 7.5 MG tablet; Take 1 tablet (7.5 mg total) by mouth daily.  Dispense: 30 tablet; Refill: 0  6. Cracked tooth - He was provided with Dental application.    Follow up: 12/17/2019 or if symptoms worsen or fail to improve.  Rorik Vespa E Raul Winterhalter 11/12/2019, 9:44 AM

## 2019-11-13 LAB — COMPREHENSIVE METABOLIC PANEL
ALT: 17 IU/L (ref 0–44)
AST: 18 IU/L (ref 0–40)
Albumin/Globulin Ratio: 1.7 (ref 1.2–2.2)
Albumin: 4.5 g/dL (ref 3.8–4.9)
Alkaline Phosphatase: 94 IU/L (ref 48–121)
BUN/Creatinine Ratio: 14 (ref 9–20)
BUN: 14 mg/dL (ref 6–24)
Bilirubin Total: 0.3 mg/dL (ref 0.0–1.2)
CO2: 28 mmol/L (ref 20–29)
Calcium: 9.5 mg/dL (ref 8.7–10.2)
Chloride: 101 mmol/L (ref 96–106)
Creatinine, Ser: 0.97 mg/dL (ref 0.76–1.27)
GFR calc Af Amer: 103 mL/min/{1.73_m2} (ref >59)
GFR calc non Af Amer: 89 mL/min/{1.73_m2} (ref >59)
Globulin, Total: 2.6 g/dL (ref 1.5–4.5)
Glucose: 86 mg/dL (ref 65–99)
Potassium: 4.6 mmol/L (ref 3.5–5.2)
Sodium: 139 mmol/L (ref 134–144)
Total Protein: 7.1 g/dL (ref 6.0–8.5)

## 2019-11-13 LAB — CBC WITH DIFFERENTIAL/PLATELET
Basophils Absolute: 0.1 10*3/uL (ref 0.0–0.2)
Basos: 0 %
EOS (ABSOLUTE): 0.5 10*3/uL — ABNORMAL HIGH (ref 0.0–0.4)
Eos: 4 %
Hematocrit: 42 % (ref 37.5–51.0)
Hemoglobin: 13.5 g/dL (ref 13.0–17.7)
Immature Grans (Abs): 0 10*3/uL (ref 0.0–0.1)
Immature Granulocytes: 0 %
Lymphocytes Absolute: 2.5 10*3/uL (ref 0.7–3.1)
Lymphs: 22 %
MCH: 28.8 pg (ref 26.6–33.0)
MCHC: 32.1 g/dL (ref 31.5–35.7)
MCV: 90 fL (ref 79–97)
Monocytes Absolute: 1 10*3/uL — ABNORMAL HIGH (ref 0.1–0.9)
Monocytes: 8 %
Neutrophils Absolute: 7.6 10*3/uL — ABNORMAL HIGH (ref 1.4–7.0)
Neutrophils: 66 %
Platelets: 379 10*3/uL (ref 150–450)
RBC: 4.69 x10E6/uL (ref 4.14–5.80)
RDW: 13.4 % (ref 11.6–15.4)
WBC: 11.7 10*3/uL — ABNORMAL HIGH (ref 3.4–10.8)

## 2019-11-13 LAB — LIPID PANEL
Chol/HDL Ratio: 6.2 ratio — ABNORMAL HIGH (ref 0.0–5.0)
Cholesterol, Total: 279 mg/dL — ABNORMAL HIGH (ref 100–199)
HDL: 45 mg/dL (ref >39)
LDL Chol Calc (NIH): 199 mg/dL — ABNORMAL HIGH (ref 0–99)
Triglycerides: 185 mg/dL — ABNORMAL HIGH (ref 0–149)
VLDL Cholesterol Cal: 35 mg/dL (ref 5–40)

## 2019-11-13 LAB — URINALYSIS
Bilirubin, UA: NEGATIVE
Glucose, UA: NEGATIVE
Ketones, UA: NEGATIVE
Leukocytes,UA: NEGATIVE
Nitrite, UA: NEGATIVE
Protein,UA: NEGATIVE
RBC, UA: NEGATIVE
Specific Gravity, UA: 1.024 (ref 1.005–1.030)
Urobilinogen, Ur: 0.2 mg/dL (ref 0.2–1.0)
pH, UA: 5.5 (ref 5.0–7.5)

## 2019-11-13 LAB — HEMOGLOBIN A1C
Est. average glucose Bld gHb Est-mCnc: 108 mg/dL
Hgb A1c MFr Bld: 5.4 % (ref 4.8–5.6)

## 2019-11-19 ENCOUNTER — Ambulatory Visit: Payer: Self-pay | Admitting: Pharmacy Technician

## 2019-11-19 DIAGNOSIS — Z79899 Other long term (current) drug therapy: Secondary | ICD-10-CM

## 2019-11-25 ENCOUNTER — Other Ambulatory Visit: Payer: Self-pay

## 2019-11-25 NOTE — Progress Notes (Signed)
Completed [facility] application and contract.  Patient agreed to all terms of the [facility] contract.    Patient approved to receive medication assistance at [facility] until time for re-certification in 2022, and as long as eligibility criteria continues to be met.    Provided patient with community resource material based on his particular needs.    Chriss Redel J. Jaques Mineer Care Manager [facility]  

## 2019-11-28 ENCOUNTER — Ambulatory Visit: Payer: Self-pay

## 2019-11-28 ENCOUNTER — Other Ambulatory Visit: Payer: Self-pay

## 2019-11-28 NOTE — Progress Notes (Unsigned)
  [facility] Visit Note  Patient: Francis Clayton MRN: [id number] Date of Birth: 1965/11/29 PCP: Patient, No Pcp Per   Francis Clayton 54 y.o. male presented for a telephone medication therapy management review with the pharmacist. Patient was identified by name and date of birth.  There were no vitals taken for this visit.  Patient Information   Past Medical History:  Diagnosis Date  . COPD (chronic obstructive pulmonary disease) (HCC)   . Gout   . Hernia, abdominal   . Tobacco abuse       Past Surgical History:  Procedure Laterality Date  . HERNIA REPAIR    . I & D EXTREMITY Right 10/01/2015   Procedure: IRRIGATION AND DEBRIDEMENT EXTREMITY;  Surgeon: Nadara Mustard, MD;  Location: [facility] OR;  Service: Orthopedics;  Laterality: Right;  . SHOULDER SURGERY       Family History  Problem Relation Age of Onset  . Cancer Mother   . Parkinson's disease Mother   . Emphysema Father     New Diagnoses (since last visit):   Family Support: Good           Social History   Substance and Sexual Activity  Alcohol Use No      Social History   Tobacco Use  Smoking Status Current Every Day Smoker  . Packs/day: 0.50  . Types: Cigarettes  Smokeless Tobacco Never Used      Health Maintenance  Topic Date Due  . Hepatitis C Screening  Never done  . COVID-19 Vaccine (1) Never done  . COLONOSCOPY  Never done  . INFLUENZA VACCINE  Never done  . TETANUS/TDAP  02/04/2024  . HIV Screening  Completed   Health Maintenance/Date Completed  Last ED visit: 09/05/19 Last Visit to PCP: 11/12/19 Next Visit to PCP: 12/17/19 Specialist Visit: 12/09/19 Dental Exam: None Eye Exam: None Prostate Exam: yes Colonoscopy: none Flu Vaccine: none Pneumonia Vaccine: 2017 COVID-19 Vaccine: none Shingrix Vaccine: none  Outpatient Encounter Medications as of 11/28/2019  Medication Sig  . albuterol (VENTOLIN HFA) 108 (90 Base) MCG/ACT inhaler Inhale 1-2 puffs into the lungs every 6  (six) hours as needed for wheezing or shortness of breath.  . fluticasone furoate-vilanterol (BREO ELLIPTA) 100-25 MCG/INH AEPB Inhale 1 puff into the lungs daily.  . meloxicam (MOBIC) 7.5 MG tablet Take 1 tablet (7.5 mg total) by mouth daily.  . pantoprazole (PROTONIX) 40 MG tablet Take 1 tablet (40 mg total) by mouth daily.  . [DISCONTINUED] nicotine (NICODERM CQ - DOSED IN MG/24 HOURS) 21 mg/24hr patch Place 1 patch (21 mg total) onto the skin daily. (Patient not taking: Reported on 10/09/2015)   No facility-administered encounter medications on file as of 11/28/2019.    Assessment and Plan:  Adherence/Compliance: [facility] providing access to medications.  COPD: Ventolin 1-2 times per week, Breo daily. Fluticasone nasal spray gives him headaches.  GERD: pantoprazole Pantoprazole working well. No issues with "gas bubbles" while on this regimen. Colonoscopy pending [employer] approval.  History of broken nose: Occasionally runs; not currently using loratadine.  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 Hx of cellulitis in right foot- hospitalized for a week- 2017; multiple injuries to right foot in past.  RTC: 1 year  Francis Clayton, PharmD [facility] Clinic-Pharmacy Operations Coordinator [phone]

## 2019-11-28 NOTE — Progress Notes (Unsigned)
  [facility] Visit Note  Patient: Francis Clayton MRN: [id number] Date of Birth: 05-03-1965 PCP: Patient, No Pcp Per   Francis Clayton 54 y.o. male presents for a *** visit today.  There were no vitals taken for this visit.  Patient Information   Past Medical History:  Diagnosis Date  . COPD (chronic obstructive pulmonary disease) (HCC)   . Gout   . Hernia, abdominal   . Tobacco abuse       Past Surgical History:  Procedure Laterality Date  . HERNIA REPAIR    . I & D EXTREMITY Right 10/01/2015   Procedure: IRRIGATION AND DEBRIDEMENT EXTREMITY;  Surgeon: Nadara Mustard, MD;  Location: [facility] OR;  Service: Orthopedics;  Laterality: Right;  . SHOULDER SURGERY       Family History  Problem Relation Age of Onset  . Cancer Mother   . Parkinson's disease Mother   . Emphysema Father     New Diagnoses (since last visit):   Family Support: {Family Support:210800003}  Lifestyle Diet: Breakfast:*** Lunch:*** Dinner:*** Drinks:***            Social History   Substance and Sexual Activity  Alcohol Use No      Social History   Tobacco Use  Smoking Status Current Every Day Smoker  . Packs/day: 0.50  . Types: Cigarettes  Smokeless Tobacco Never Used      Health Maintenance  Topic Date Due  . Hepatitis C Screening  Never done  . COVID-19 Vaccine (1) Never done  . COLONOSCOPY  Never done  . INFLUENZA VACCINE  Never done  . TETANUS/TDAP  02/04/2024  . HIV Screening  Completed   Health Maintenance/Date Completed  Last ED visit: *** Last Visit to PCP: *** Next Visit to PCP: *** Specialist Visit: *** Dental Exam: *** Eye Exam: *** Prostate Exam: *** Pelvic/PAP Exam: *** Mammogram: *** DEXA: *** Colonoscopy: *** Flu Vaccine: *** Pneumonia Vaccine: *** COVID-19 Vaccine: *** Shingrix Vaccine: ***   Outpatient Encounter Medications as of 11/28/2019  Medication Sig  . albuterol (VENTOLIN HFA) 108 (90 Base) MCG/ACT inhaler Inhale 1-2 puffs into  the lungs every 6 (six) hours as needed for wheezing or shortness of breath.  . fluticasone furoate-vilanterol (BREO ELLIPTA) 100-25 MCG/INH AEPB Inhale 1 puff into the lungs daily.  . meloxicam (MOBIC) 7.5 MG tablet Take 1 tablet (7.5 mg total) by mouth daily.  . nicotine (NICODERM CQ - DOSED IN MG/24 HOURS) 21 mg/24hr patch Place 1 patch (21 mg total) onto the skin daily. (Patient not taking: Reported on 10/09/2015)  . pantoprazole (PROTONIX) 40 MG tablet Take 1 tablet (40 mg total) by mouth daily.   No facility-administered encounter medications on file as of 11/28/2019.     Assessment and Plan:  Adherence/Compliance: COPD: Breo/Ventolin  Pain: Meloxicam  GERD: Pantoprazole  Smoking Cessation:

## 2019-12-02 ENCOUNTER — Telehealth: Payer: Self-pay | Admitting: General Practice

## 2019-12-02 ENCOUNTER — Other Ambulatory Visit: Payer: Self-pay

## 2019-12-02 NOTE — Telephone Encounter (Signed)
-----   Message from Rolm Gala, NP sent at 12/02/2019 10:15 AM EDT ----- Pls reschedule F/U appointment to evening Thursday 12/19/2019 evening clinic. Pls call and notify patient with time and date, make telephone note. Thank you

## 2019-12-04 ENCOUNTER — Other Ambulatory Visit: Payer: Self-pay

## 2019-12-08 ENCOUNTER — Telehealth: Payer: Self-pay | Admitting: Pharmacist

## 2019-12-08 NOTE — Telephone Encounter (Signed)
12/08/2019 2:17:48 PM - Virgel Bouquet Ellipt to patient & dr  -- Rhetta Mura - Monday, December 08, 2019 2:16 PM --Received a pharmacy printout for Breo Ellipta 100/72mcg Inhale 1 puff into the lungs every day, mailing [employer] form to patient to sign & return, also sending script to [facility] for provider to sign. 12/08/2019 2:15:54 PM - ProAir to patient & dr to sign    -- Rhetta Mura - Monday, December 08, 2019 2:14 PM --Received a pharmacy printout for ProAir Inhale 1-2 puffs into the lungs every 6 hours as needed for wheezing or shortness of breath (replaces Albuterol). Mailing [employer] application to patient to sign & return, also sending provider portion to [facility].

## 2019-12-09 ENCOUNTER — Ambulatory Visit: Payer: Self-pay | Admitting: Specialist

## 2019-12-11 ENCOUNTER — Telehealth: Payer: Self-pay | Admitting: Pharmacist

## 2019-12-11 NOTE — Telephone Encounter (Signed)
12/11/2019 11:38:19 AM - Earlie Server & ProAir pending  -- Rhetta Mura - Thursday, December 11, 2019 11:37 AM --I have received the signed script/form back from provider for [facility] & ProAir, holding for patient to return his forms mailed to him 12/08/2019.

## 2019-12-17 ENCOUNTER — Other Ambulatory Visit: Payer: Self-pay | Admitting: Gerontology

## 2019-12-17 ENCOUNTER — Ambulatory Visit: Payer: Self-pay | Admitting: Gerontology

## 2019-12-17 DIAGNOSIS — M79671 Pain in right foot: Secondary | ICD-10-CM

## 2019-12-18 ENCOUNTER — Ambulatory Visit: Payer: Self-pay | Admitting: Family Medicine

## 2019-12-18 ENCOUNTER — Other Ambulatory Visit: Payer: Self-pay

## 2019-12-18 VITALS — BP 133/80 | HR 77 | Ht 76.5 in | Wt 200.2 lb

## 2019-12-18 DIAGNOSIS — M79671 Pain in right foot: Secondary | ICD-10-CM

## 2019-12-18 DIAGNOSIS — Z8719 Personal history of other diseases of the digestive system: Secondary | ICD-10-CM

## 2019-12-18 DIAGNOSIS — E7889 Other lipoprotein metabolism disorders: Secondary | ICD-10-CM

## 2019-12-18 MED ORDER — PANTOPRAZOLE SODIUM 40 MG PO TBEC: 40.0000 mg | DELAYED_RELEASE_TABLET | Freq: Every day | ORAL | 2 refills | Status: AC

## 2019-12-18 MED ORDER — MELOXICAM 7.5 MG PO TABS: 7.5000 mg | ORAL_TABLET | Freq: Every day | ORAL | 0 refills | Status: DC

## 2019-12-18 NOTE — Progress Notes (Signed)
[facility]   Progress Note: General Provider: Mike Gip, FNP  SUBJECTIVE:   Francis Clayton is a 54 y.o. male who  has a past medical history of Arthritis, COPD (chronic obstructive pulmonary disease) (HCC), Gout, Hernia, abdominal, and Tobacco abuse.. Patient presents today for Follow-up (cholesterol)  Patient with non-fasting lipid panel on 11/12/2019. He was instructed to follow a low cholesterol/heart healthy diet and possibly start on statin medication. Patient states that he does not have a history of elevated cholesterol. He states that he has not been eating as healthy due [place].  He is still having right heel pain and is scheduled to meet with ortho due to this. He did get a better pair of shoes with memory foam.   Review of Systems  Constitutional: Negative.   HENT: Negative.   Eyes: Negative.   Respiratory: Negative.   Cardiovascular: Negative.   Gastrointestinal: Negative.   Genitourinary: Negative.   Musculoskeletal:       Right heel pain  Skin: Negative.   Neurological: Negative.   Psychiatric/Behavioral: Negative.      OBJECTIVE: BP 133/80 (BP Location: Right Arm, Patient Position: Sitting)   Pulse 77   Ht 6' 4.5" (1.943 m)   Wt 200 lb 3.2 oz (90.8 kg)   SpO2 96%   BMI 24.05 kg/m   Wt Readings from Last 3 Encounters:  12/18/19 200 lb 3.2 oz (90.8 kg)  11/12/19 199 lb 11.2 oz (90.6 kg)  08/09/18 185 lb (83.9 kg)     Physical Exam Vitals and nursing note reviewed.  Constitutional:      General: He is not in acute distress.    Appearance: He is well-developed.  HENT:     Head: Normocephalic and atraumatic.  Eyes:     Conjunctiva/sclera: Conjunctivae normal.     Pupils: Pupils are equal, round, and reactive to light.  Cardiovascular:     Rate and Rhythm: Normal rate and regular rhythm.     Heart sounds: Normal heart sounds.  Pulmonary:     Effort: Pulmonary effort is normal. No respiratory distress.      Breath sounds: Normal breath sounds.  Musculoskeletal:        General: Normal range of motion.     Cervical back: Normal range of motion.  Skin:    General: Skin is warm and dry.  Neurological:     Mental Status: He is alert and oriented to person, place, and time.  Psychiatric:        Mood and Affect: Mood normal.        Behavior: Behavior normal.        Thought Content: Thought content normal.     ASSESSMENT/PLAN:   1. Lipids abnormal Patient will repeat labs fasting.  - Lipid Panel With LDL/HDL Ratio; Future  2. Pain of right heel Ortho clinic appt.  - meloxicam (MOBIC) 7.5 MG tablet; Take 1 tablet (7.5 mg total) by mouth daily.  Dispense: 30 tablet; Refill: 0  3. History of gastroesophageal reflux (GERD) - pantoprazole (PROTONIX) 40 MG tablet; Take 1 tablet (40 mg total) by mouth daily.  Dispense: 30 tablet; Refill: 2    Return in about 3 months (around 03/18/2020), or night fasting lab., for follow up for high cholesterol.    The patient was given clear instructions to go to ER or return to medical center if symptoms do not improve, worsen or new problems develop. The patient verbalized understanding and agreed with  plan of care.   Ms. Freda Jackson. Riley Lam, FNP-BC [facility]

## 2019-12-18 NOTE — Patient Instructions (Signed)
I would like to bring you back for fasting lab work. This means that you need to not eat after lunch on the day of the of the evening lab clinic. You can have water, black coffee, or unsweetened tea.    High Cholesterol  High cholesterol is a condition in which the blood has high levels of a white, waxy, fat-like substance (cholesterol). The human body needs small amounts of cholesterol. The liver makes all the cholesterol that the body needs. Extra (excess) cholesterol comes from the food that we eat. Cholesterol is carried from the liver by the blood through the blood vessels. If you have high cholesterol, deposits (plaques) may build up on the walls of your blood vessels (arteries). Plaques make the arteries narrower and stiffer. Cholesterol plaques increase your risk for heart attack and stroke. Work with your health care provider to keep your cholesterol levels in a healthy range. What increases the risk? This condition is more likely to develop in people who:  Eat foods that are high in animal fat (saturated fat) or cholesterol.  Are overweight.  Are not getting enough exercise.  Have a family history of high cholesterol. What are the signs or symptoms? There are no symptoms of this condition. How is this diagnosed? This condition may be diagnosed from the results of a blood test.  If you are older than age 21, your health care provider may check your cholesterol every 4-6 years.  You may be checked more often if you already have high cholesterol or other risk factors for heart disease. The blood test for cholesterol measures:  "Bad" cholesterol (LDL cholesterol). This is the main type of cholesterol that causes heart disease. The desired level for LDL is less than 100.  "Good" cholesterol (HDL cholesterol). This type helps to protect against heart disease by cleaning the arteries and carrying the LDL away. The desired level for HDL is 60 or higher.  Triglycerides. These are fats  that the body can store or burn for energy. The desired number for triglycerides is lower than 150.  Total cholesterol. This is a measure of the total amount of cholesterol in your blood, including LDL cholesterol, HDL cholesterol, and triglycerides. A healthy number is less than 200. How is this treated? This condition is treated with diet changes, lifestyle changes, and medicines. Diet changes  This may include eating more whole grains, fruits, vegetables, nuts, and fish.  This may also include cutting back on red meat and foods that have a lot of added sugar. Lifestyle changes  Changes may include getting at least 40 minutes of aerobic exercise 3 times a week. Aerobic exercises include walking, biking, and swimming. Aerobic exercise along with a healthy diet can help you maintain a healthy weight.  Changes may also include quitting smoking. Medicines  Medicines are usually given if diet and lifestyle changes have failed to reduce your cholesterol to healthy levels.  Your health care provider may prescribe a statin medicine. Statin medicines have been shown to reduce cholesterol, which can reduce the risk of heart disease. Follow these instructions at home: Eating and drinking If told by your health care provider:  Eat chicken (without skin), fish, veal, shellfish, ground [place] breast, and round or loin cuts of red meat.  Do not eat fried foods or fatty meats, such as hot dogs and salami.  Eat plenty of fruits, such as apples.  Eat plenty of vegetables, such as broccoli, potatoes, and carrots.  Eat beans, peas, and lentils.  Eat grains such as barley, rice, couscous, and bulgur wheat.  Eat pasta without cream sauces.  Use skim or nonfat milk, and eat low-fat or nonfat yogurt and cheeses.  Do not eat or drink whole milk, cream, ice cream, egg yolks, or hard cheeses.  Do not eat stick margarine or tub margarines that contain trans fats (also called partially hydrogenated  oils).  Do not eat saturated tropical oils, such as coconut oil and palm oil.  Do not eat cakes, cookies, crackers, or other baked goods that contain trans fats.  General instructions  Exercise as directed by your health care provider. Increase your activity level with activities such as gardening, walking, and taking the stairs.  Take over-the-counter and prescription medicines only as told by your health care provider.  Do not use any products that contain nicotine or tobacco, such as cigarettes and e-cigarettes. If you need help quitting, ask your health care provider.  Keep all follow-up visits as told by your health care provider. This is important. Contact a health care provider if:  You are struggling to maintain a healthy diet or weight.  You need help to start on an exercise program.  You need help to stop smoking. Get help right away if:  You have chest pain.  You have trouble breathing. This information is not intended to replace advice given to you by your health care provider. Make sure you discuss any questions you have with your health care provider. Document Revised: 03/16/2017 Document Reviewed: 09/11/2015 Elsevier Patient Education  2020 [employer].

## 2019-12-23 ENCOUNTER — Ambulatory Visit: Payer: Self-pay | Admitting: Specialist

## 2019-12-23 ENCOUNTER — Ambulatory Visit
Admission: RE | Admit: 2019-12-23 | Discharge: 2019-12-23 | Disposition: A | Discharge status: Home / Self Care | Payer: Self-pay | Attending: Specialist | Admitting: Specialist

## 2019-12-23 ENCOUNTER — Ambulatory Visit
Admission: RE | Admit: 2019-12-23 | Discharge: 2019-12-23 | Disposition: A | Discharge status: Home / Self Care | Payer: Self-pay | Source: Ambulatory Visit | Attending: Specialist | Admitting: Specialist

## 2019-12-23 ENCOUNTER — Other Ambulatory Visit: Payer: Self-pay

## 2019-12-23 DIAGNOSIS — M79671 Pain in right foot: Secondary | ICD-10-CM

## 2019-12-23 NOTE — Progress Notes (Signed)
  Subjective:     Patient ID: Francis Clayton, male   DOB: [birth year], 54 y.o.   MRN: [id number]  HPI  53yo male that complains about 4 months ago walking in his slippers stepped on his stone. Pain became bad enough that he went to the [facility] ED where they examined him but offered no treatment and no imaging.  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. He has obtained good work boots with OTC heel inserts. These do not help.     Review of Systems     Objective:   Physical Exam   With his shoes, I did not appreciate a limp but he tells me he never puts his heel fully on the ground. Without his shoes, he has a marked and intelligent gait.   On inspection, there is no obvious swelling, erythema.   ROM DF+15; PF 40.  He is exquisitely TTP under the R heel. There is no obvious callus.      Assessment:        Plan:       I will order an xray of R foot

## 2019-12-29 ENCOUNTER — Telehealth: Payer: Self-pay | Admitting: Pharmacist

## 2019-12-29 NOTE — Telephone Encounter (Signed)
12/29/2019 2:13:29 PM - Virgel Bouquet Ellipta & ProAir faxed  -- Rhetta Mura - Monday, December 29, 2019 2:11 PM --I have faxed [employer] application for Breo Ellipta 100/25 mcg Inhale 1 puff into the lungs every day & [employer] application for ProAir HFA 0.09gm Inhale 1-2 puffs into the lungs every 6 hours as needed for wheezing & shortness of breath.

## 2020-01-20 ENCOUNTER — Ambulatory Visit: Payer: Self-pay | Admitting: Specialist

## 2020-02-26 ENCOUNTER — Telehealth: Payer: Self-pay | Admitting: Gerontology

## 2020-03-01 ENCOUNTER — Other Ambulatory Visit: Payer: Self-pay

## 2020-03-01 ENCOUNTER — Encounter (HOSPITAL_COMMUNITY): Payer: Self-pay

## 2020-03-01 ENCOUNTER — Emergency Department (HOSPITAL_COMMUNITY)
Admission: EM | Admit: 2020-03-01 | Discharge: 2020-03-01 | Disposition: A | Discharge status: Home / Self Care | Payer: Self-pay | Attending: Emergency Medicine | Admitting: Emergency Medicine

## 2020-03-01 ENCOUNTER — Emergency Department (HOSPITAL_COMMUNITY): Payer: Self-pay

## 2020-03-01 DIAGNOSIS — R509 Fever, unspecified: Secondary | ICD-10-CM | POA: Insufficient documentation

## 2020-03-01 DIAGNOSIS — R0602 Shortness of breath: Secondary | ICD-10-CM | POA: Insufficient documentation

## 2020-03-01 DIAGNOSIS — Z5321 Procedure and treatment not carried out due to patient leaving prior to being seen by health care provider: Secondary | ICD-10-CM | POA: Insufficient documentation

## 2020-03-01 DIAGNOSIS — R5383 Other fatigue: Secondary | ICD-10-CM | POA: Insufficient documentation

## 2020-03-01 DIAGNOSIS — R059 Cough, unspecified: Secondary | ICD-10-CM | POA: Insufficient documentation

## 2020-03-01 DIAGNOSIS — R0981 Nasal congestion: Secondary | ICD-10-CM | POA: Insufficient documentation

## 2020-03-01 HISTORY — DX: Gastro-esophageal reflux disease without esophagitis: K21.9

## 2020-03-01 NOTE — ED Triage Notes (Signed)
Pt arrived via walk in, c/o fevers/chills at home, sinus congestions, fatigue, productive cough, SOB, body aches. Denies any sick contacts.

## 2020-03-11 ENCOUNTER — Other Ambulatory Visit: Payer: Self-pay

## 2020-03-18 ENCOUNTER — Ambulatory Visit: Payer: Self-pay

## 2020-04-01 ENCOUNTER — Ambulatory Visit: Payer: Self-pay

## 2021-03-15 ENCOUNTER — Other Ambulatory Visit: Payer: Self-pay

## 2021-06-23 IMAGING — CR DG FOOT COMPLETE 3+V*R*
1 series · 3 of 3 positions shown · non-contrast
Comparison: None.

CLINICAL DATA: Right heel pain after injury several months ago.

EXAM:
RIGHT FOOT COMPLETE - 3+ VIEW

[Series 1: dg foot complete right · 0.14mm/px · 3 of 3 slices shown]
[im 1/3]
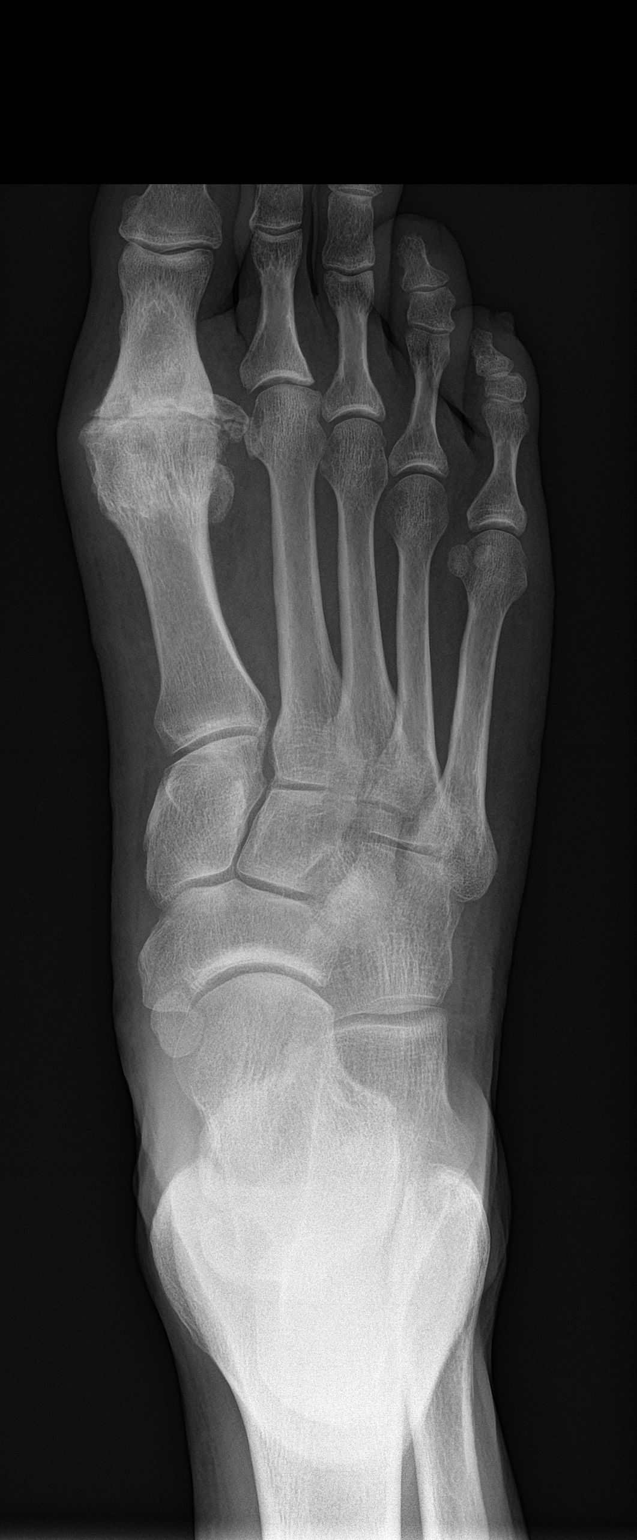
[im 2/3]
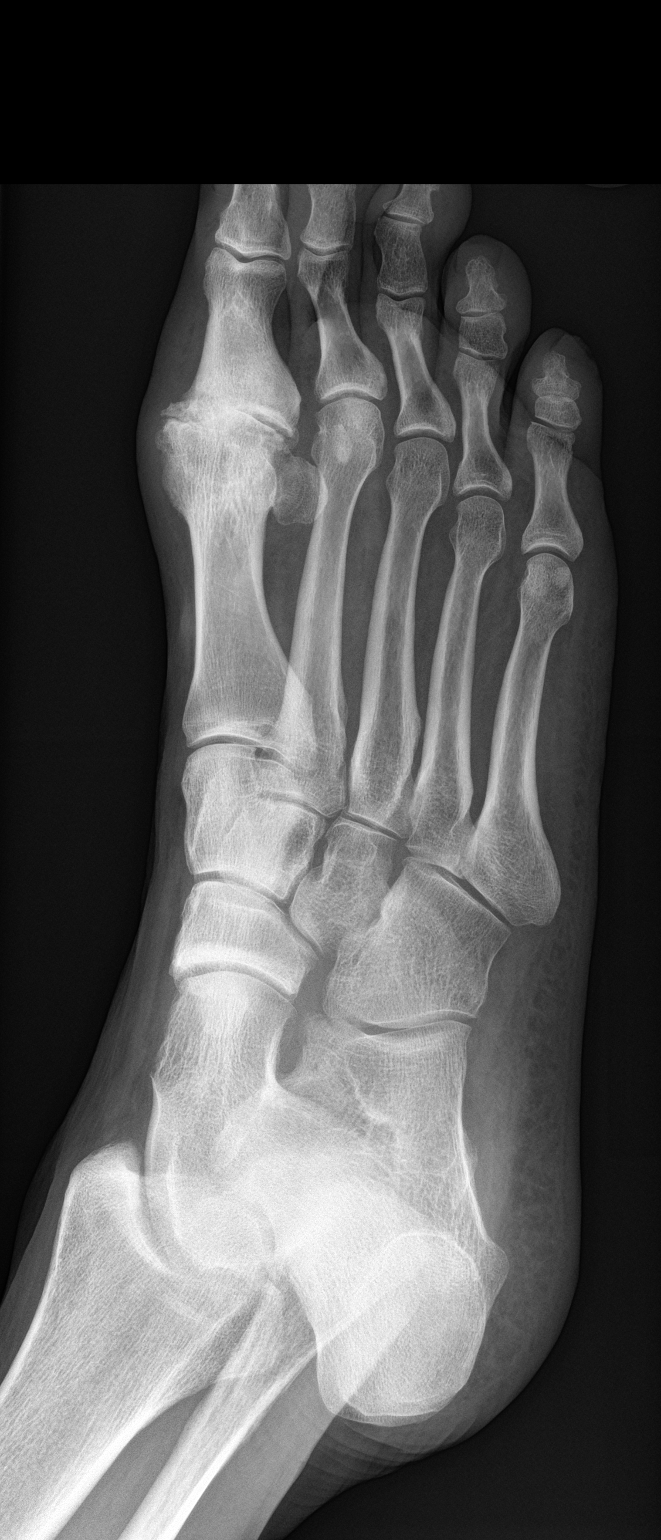
[im 3/3]
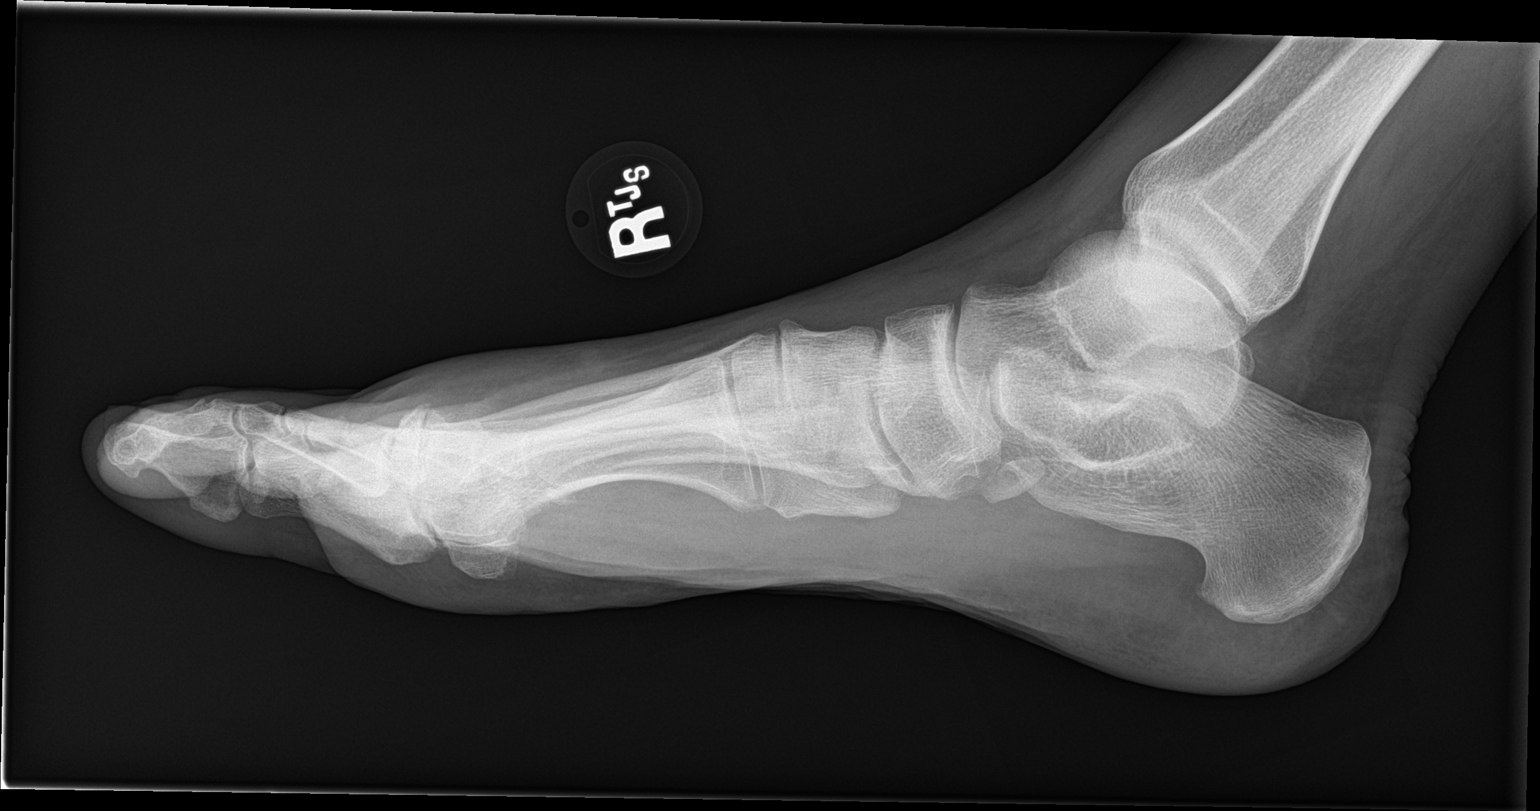

[3 of 3 positions shown; findings below may reference images not displayed]

FINDINGS: There is no evidence of fracture or dislocation. Severe degenerative
changes seen involving the first metatarsophalangeal joint. Soft
tissues are unremarkable.
IMPRESSION: Severe degenerative joint disease of the first metatarsophalangeal
joint. No acute abnormality seen in the right foot.

## 2023-03-28 DIAGNOSIS — I4892 Unspecified atrial flutter: Secondary | ICD-10-CM | POA: Insufficient documentation

## 2023-03-28 HISTORY — DX: Unspecified atrial flutter: I48.92

## 2023-04-03 DIAGNOSIS — Z6822 Body mass index (BMI) 22.0-22.9, adult: Secondary | ICD-10-CM | POA: Insufficient documentation

## 2023-04-03 DIAGNOSIS — R079 Chest pain, unspecified: Secondary | ICD-10-CM | POA: Insufficient documentation

## 2023-04-03 DIAGNOSIS — R9431 Abnormal electrocardiogram [ECG] [EKG]: Secondary | ICD-10-CM | POA: Insufficient documentation

## 2023-04-05 DIAGNOSIS — I340 Nonrheumatic mitral (valve) insufficiency: Secondary | ICD-10-CM | POA: Diagnosis not present

## 2023-04-06 DIAGNOSIS — I4892 Unspecified atrial flutter: Secondary | ICD-10-CM | POA: Diagnosis not present

## 2023-04-06 DIAGNOSIS — F17210 Nicotine dependence, cigarettes, uncomplicated: Secondary | ICD-10-CM | POA: Diagnosis not present

## 2023-04-08 DIAGNOSIS — I4892 Unspecified atrial flutter: Secondary | ICD-10-CM | POA: Diagnosis not present

## 2023-04-08 DIAGNOSIS — F17210 Nicotine dependence, cigarettes, uncomplicated: Secondary | ICD-10-CM | POA: Diagnosis not present

## 2023-04-10 ENCOUNTER — Encounter: Payer: Self-pay | Admitting: Cardiology

## 2023-04-10 DIAGNOSIS — M1990 Unspecified osteoarthritis, unspecified site: Secondary | ICD-10-CM | POA: Insufficient documentation

## 2023-04-10 DIAGNOSIS — M109 Gout, unspecified: Secondary | ICD-10-CM | POA: Insufficient documentation

## 2023-04-10 DIAGNOSIS — K219 Gastro-esophageal reflux disease without esophagitis: Secondary | ICD-10-CM | POA: Insufficient documentation

## 2023-04-10 DIAGNOSIS — K469 Unspecified abdominal hernia without obstruction or gangrene: Secondary | ICD-10-CM | POA: Insufficient documentation

## 2023-04-10 NOTE — Progress Notes (Signed)
Cardiology Office Note:    Date:  04/12/2023   ID:  Francis Clayton, DOB 1965-05-27, MRN [id number]  PCP:  Sherlie Ban, NP  Cardiologist:  Norman Herrlich, MD   Referring MD: Sherlie Ban, NP  ASSESSMENT:    1. Atrial flutter, unspecified type (HCC)   2. Chronic anticoagulation   3. Chronic obstructive pulmonary disease, unspecified COPD type (HCC)   4. Right ventricular enlargement    PLAN:    In order of problems listed above:  Continue beta-blocker reduce his calcium channel blocker long-acting once daily with his low blood pressure he is asymptomatic continue anticoagulant 21 days uninterrupted and plan cardioversion first week February If recurrent referral to EP for catheter ablation Markedly improved with his bronchodilator he is no longer short of breath He will need a follow-up echocardiogram several months after resuming maintaining sinus rhythm  Next appointment 6 weeks   Medication Adjustments/Labs and Tests Ordered: Current medicines are reviewed at length with the patient today.  Concerns regarding medicines are outlined above.  Orders Placed This Encounter  Procedures   EKG 12-Lead   No orders of the defined types were placed in this encounter.    No chief complaint on file.   History of Present Illness:    Francis Clayton is a 58 y.o. male with a history of COPD and ongoing cigarette smoking who is being seen today for the evaluation of recently recognized atrial flutter on office visit 04/03/2023.  At the request of Sherlie Ban, NP.  He was seen with his primary care physician 04/03/2023 with chief complaint of shortness of breath and chest pain he has a background history of COPD as well as acute liver failure with a hospitalization several years prior and is a current smoker.  He was referred to cardiology because of an EKG that was described as atrial flutter.  I personally reviewed that EKG which shows typical atrial flutter with ventricular rate  of 133 bpm 2-1 conduction.  An echocardiogram ordered by the hospitalist at [facility] 04/05/2023 which showed low normal ejection fraction 50 to 55% right ventricular enlargement both the left and right atrium were dilated there is mitral regurgitation and mild elevation of pulmonary artery systolic pressure  From reading the consult and the progress note it appears as if he was offered TEE guided cardioversion and he deferred.  There is mention that the heart rate is difficult to control and he is on combined beta-blocker calcium channel blocker therapy  Despite remaining in rapid atrial flutter he feels well He is not having edema shortness of breath lightheadedness chest pain palpitation or syncope We will plan on cardioversion the first week of February after 21 days of effective anticoagulation with Eliquis His blood pressure is 90 systolic but he is not lightheaded I will reduce the dose of his long-acting calcium channel blocker to once daily continue his metoprolol We discussed EP catheter ablation but he wants to start with cardioversion and if recurrent ED referral to EP He is avoiding alcohol Past Medical History:  Diagnosis Date   Acid reflux    Arthritis    Cellulitis of right ankle 09/27/2015   Cellulitis of right lower leg 09/29/2015   Closed fracture of one rib of left side 01/19/2017   COPD (chronic obstructive pulmonary disease) (HCC)    Cracked tooth 11/12/2019   Encounter to establish care 11/12/2019   Gout    Hemoperitoneum 01/18/2017   Hernia, abdominal    History of  COPD 11/12/2019   History of gastroesophageal reflux (GERD) 11/12/2019   Pain of right heel 11/12/2019   Sepsis (HCC) 09/27/2015   Spleen laceration 01/18/2017   Tobacco abuse    Viral hepatitis A without hepatic coma 05/21/2019    Past Surgical History:  Procedure Laterality Date   HERNIA REPAIR     I & D EXTREMITY Right 10/01/2015   Procedure: IRRIGATION AND DEBRIDEMENT EXTREMITY;   Surgeon: Nadara Mustard, MD;  Location: [facility] OR;  Service: Orthopedics;  Laterality: Right;   SHOULDER SURGERY      Current Medications: Current Meds  Medication Sig   atorvastatin (LIPITOR) 20 MG tablet Take 20 mg by mouth at bedtime.   buPROPion (WELLBUTRIN XL) 150 MG 24 hr tablet Take 150 mg by mouth daily.   ELIQUIS 5 MG TABS tablet Take 5 mg by mouth 2 (two) times daily.   EQ ASPIRIN LOW DOSE 81 MG chewable tablet Chew 81 mg by mouth daily.   metoprolol succinate (TOPROL-XL) 100 MG 24 hr tablet Take 100 mg by mouth 2 (two) times daily.   nicotine (NICODERM CQ - DOSED IN MG/24 HOURS) 14 mg/24hr patch Place 14 mg onto the skin daily.   nitroGLYCERIN (NITROSTAT) 0.4 MG SL tablet Place 0.4 mg under the tongue every 5 (five) minutes as needed for chest pain.   pantoprazole (PROTONIX) 40 MG tablet Take 1 tablet (40 mg total) by mouth daily.   SYMBICORT 160-4.5 MCG/ACT inhaler Inhale 2 puffs into the lungs 2 (two) times daily.   [DISCONTINUED] diltiazem (CARDIZEM CD) 180 MG 24 hr capsule Take 180 mg by mouth 2 (two) times daily.     Allergies:   Patient has no known allergies.   Social History   Socioeconomic History   Marital status: Single    Spouse name: Not on file   Number of children: Not on file   Years of education: Not on file   Highest education level: Not on file  Occupational History   Not on file  Tobacco Use   Smoking status: Every Day    Current packs/day: 0.50    Types: Cigarettes   Smokeless tobacco: Never  Substance and Sexual Activity   Alcohol use: No   Drug use: No   Sexual activity: Not on file  Other Topics Concern   Not on file  Social History Narrative   Not on file   Social Drivers of Health   Financial Resource Strain: High Risk (05/26/2019)   Received from [facility] visits prior to 05/27/2022.   Overall Financial Resource Strain (CARDIA)    Difficulty of Paying Living Expenses: Very hard  Food Insecurity: Food Insecurity  Present (05/26/2019)   Received from [facility] visits prior to 05/27/2022.   Hunger Vital Sign    Worried About Running Out of Food in the Last Year: Often true    Ran Out of Food in the Last Year: Sometimes true  Transportation Needs: Unmet Transportation Needs (05/26/2019)   Received from [facility] visits prior to 05/27/2022.   PRAPARE - Administrator, Civil Service (Medical): Yes    Lack of Transportation (Non-Medical): Yes  Physical Activity: Not on file  Stress: Not on file  Social Connections: Not on file     Family History: The patient's family history includes Cancer in his mother; Emphysema in his father; Parkinson's disease in his mother.  ROS:   ROS Please see the history  of present illness.     All other systems reviewed and are negative.  EKGs/Labs/Other Studies Reviewed:    He remains in atrial flutter too long conduction right bundle branch block today  Physical Exam:    VS:  BP 90/78   Pulse (!) 120   Ht 6\' 4"  (1.93 m)   Wt 193 lb 9.6 oz (87.8 kg)   SpO2 96%   BMI 23.57 kg/m     Wt Readings from Last 3 Encounters:  04/12/23 193 lb 9.6 oz (87.8 kg)  12/18/19 200 lb 3.2 oz (90.8 kg)  11/12/19 199 lb 11.2 oz (90.6 kg)     GEN:  Well nourished, well developed in no acute distress HEENT: Normal NECK: No JVD; No carotid bruits LYMPHATICS: No lymphadenopathy CARDIAC: RRR, no murmurs, rubs, gallops RESPIRATORY:  Clear to auscultation without rales, wheezing or rhonchi  ABDOMEN: Soft, non-tender, non-distended MUSCULOSKELETAL:  No edema; No deformity  SKIN: Warm and dry NEUROLOGIC:  Alert and oriented x 3 PSYCHIATRIC:  Normal affect     Signed, Norman Herrlich, MD  04/12/2023 1:23 PM    [facility]

## 2023-04-11 DIAGNOSIS — Z716 Tobacco abuse counseling: Secondary | ICD-10-CM | POA: Insufficient documentation

## 2023-04-11 DIAGNOSIS — R0600 Dyspnea, unspecified: Secondary | ICD-10-CM | POA: Insufficient documentation

## 2023-04-12 ENCOUNTER — Ambulatory Visit: Payer: Medicaid Other | Attending: Cardiology | Admitting: Cardiology

## 2023-04-12 ENCOUNTER — Encounter: Payer: Self-pay | Admitting: Cardiology

## 2023-04-12 VITALS — BP 90/78 | HR 120 | Ht 76.0 in | Wt 193.6 lb

## 2023-04-12 DIAGNOSIS — M1990 Unspecified osteoarthritis, unspecified site: Secondary | ICD-10-CM

## 2023-04-12 DIAGNOSIS — Z7901 Long term (current) use of anticoagulants: Secondary | ICD-10-CM

## 2023-04-12 DIAGNOSIS — I4892 Unspecified atrial flutter: Secondary | ICD-10-CM | POA: Diagnosis not present

## 2023-04-12 DIAGNOSIS — I517 Cardiomegaly: Secondary | ICD-10-CM | POA: Diagnosis not present

## 2023-04-12 DIAGNOSIS — J449 Chronic obstructive pulmonary disease, unspecified: Secondary | ICD-10-CM

## 2023-04-12 MED ORDER — DILTIAZEM HCL ER COATED BEADS 180 MG PO CP24: 180.0000 mg | ORAL_CAPSULE | Freq: Every day | ORAL | 3 refills | Status: DC

## 2023-04-12 NOTE — Patient Instructions (Addendum)
Medication Instructions:  Your physician has recommended you make the following change in your medication:   START: Diltiazem 180 mg daily STOP: Aspirin  *If you need a refill on your cardiac medications before your next appointment, please call your pharmacy*   Lab Work: Your physician recommends that you return for lab work in:   Labs today: CBC, BMP  If you have labs (blood work) drawn today and your tests are completely normal, you will receive your results only by: MyChart Message (if you have MyChart) OR A paper copy in the mail If you have any lab test that is abnormal or we need to change your treatment, we will call you to review the results.   Testing/Procedures: None   Follow-Up: At [facility], you and your health needs are our priority.  As part of our continuing mission to provide you with exceptional heart care, we have created designated Provider Care Teams.  These Care Teams include your primary Cardiologist (physician) and Advanced Practice Providers (APPs -  Physician Assistants and Nurse Practitioners) who all work together to provide you with the care you need, when you need it.  We recommend signing up for the patient portal called "MyChart".  Sign up information is provided on this After Visit Summary.  MyChart is used to connect with patients for Virtual Visits (Telemedicine).  Patients are able to view lab/test results, encounter notes, upcoming appointments, etc.  Non-urgent messages can be sent to your provider as well.   To learn more about what you can do with MyChart, go to [email or web address].    Your next appointment:   6 week(s)  Provider:   Norman Herrlich, MD    Other Instructions No alcohol

## 2023-04-13 LAB — CBC
Hematocrit: 43.7 % (ref 37.5–51.0)
Hemoglobin: 14.6 g/dL (ref 13.0–17.7)
MCH: 31.1 pg (ref 26.6–33.0)
MCHC: 33.4 g/dL (ref 31.5–35.7)
MCV: 93 fL (ref 79–97)
Platelets: 378 10*3/uL (ref 150–450)
RBC: 4.7 x10E6/uL (ref 4.14–5.80)
RDW: 12.3 % (ref 11.6–15.4)
WBC: 8.7 10*3/uL (ref 3.4–10.8)

## 2023-04-13 LAB — BASIC METABOLIC PANEL
BUN/Creatinine Ratio: 18 (ref 9–20)
BUN: 15 mg/dL (ref 6–24)
CO2: 20 mmol/L (ref 20–29)
Calcium: 9.3 mg/dL (ref 8.7–10.2)
Chloride: 104 mmol/L (ref 96–106)
Creatinine, Ser: 0.84 mg/dL (ref 0.76–1.27)
Glucose: 95 mg/dL (ref 70–99)
Potassium: 4.9 mmol/L (ref 3.5–5.2)
Sodium: 141 mmol/L (ref 134–144)
eGFR: 102 mL/min/{1.73_m2} (ref >59)

## 2023-05-22 ENCOUNTER — Telehealth: Payer: Self-pay | Admitting: *Deleted

## 2023-05-22 MED ORDER — METOPROLOL SUCCINATE ER 100 MG PO TB24: 100.0000 mg | ORAL_TABLET | Freq: Two times a day (BID) | ORAL | 3 refills | Status: DC

## 2023-05-22 NOTE — Telephone Encounter (Signed)
 Rx refill sent to pharmacy.

## 2023-05-24 ENCOUNTER — Ambulatory Visit: Payer: Medicaid Other | Admitting: Cardiology

## 2023-05-25 ENCOUNTER — Ambulatory Visit: Payer: Medicaid Other | Admitting: Cardiology

## 2023-05-27 ENCOUNTER — Encounter: Payer: Self-pay | Admitting: Cardiology

## 2023-05-27 NOTE — Progress Notes (Deleted)
  Cardiology Office Note:  .   Date:  05/27/2023  ID:  Francis Clayton, DOB Nov 30, 1965, MRN [id number] PCP: Sherlie Ban, NP  [facility] Providers Cardiologist:  None { Click to update primary MD,subspecialty MD or APP then REFRESH:1}   History of Present Illness: .   Francis Clayton is a 58 y.o. male with a past medical history of atrial flutter, tobacco abuse, aortic atherosclerosis, COPD  04/05/2023 echo EF 50-55%, RV moderately enlarged, LA moderately dilated and RA severely dilated, mild MR, trivial TR, PASP 40 mmHg  He was admitted to [facility] on 04/06/2023 to 04/08/2023 for atrial flutter RVR, he was starting on DOAC, some issues with hypotension.  Lab work was unrevealing, including TSH.  Evaluated by Dr. Dulce Sellar on 04/12/2023, his calcium channel blocker was reduced, plans to continue without interruption of his DOAC for 21 days and then proceed with DCCV.  Pete echo in a few months. Dccv  LDL was 143, normal LFTs.  ROS: ***  Studies Reviewed: .        *** Risk Assessment/Calculations:   {Does this patient have ATRIAL FIBRILLATION?:[id number]} No BP recorded.  {Refresh Note OR Click here to enter BP  :1}***       Physical Exam:   VS:  There were no vitals taken for this visit.   Wt Readings from Last 3 Encounters:  04/12/23 193 lb 9.6 oz (87.8 kg)  12/18/19 200 lb 3.2 oz (90.8 kg)  11/12/19 199 lb 11.2 oz (90.6 kg)    GEN: Well nourished, well developed in no acute distress NECK: No JVD; No carotid bruits CARDIAC: ***RRR, no murmurs, rubs, gallops RESPIRATORY:  Clear to auscultation without rales, wheezing or rhonchi  ABDOMEN: Soft, non-tender, non-distended EXTREMITIES:  No edema; No deformity   ASSESSMENT AND PLAN: .   ***    {Are you ordering a CV Procedure (e.g. stress test, cath, DCCV, TEE, etc)?   Press F2        :[id number]}  Dispo: ***  Signed, Flossie Dibble, NP

## 2023-05-28 ENCOUNTER — Ambulatory Visit: Payer: Medicaid Other | Admitting: Cardiology

## 2023-05-28 DIAGNOSIS — J449 Chronic obstructive pulmonary disease, unspecified: Secondary | ICD-10-CM

## 2023-05-28 DIAGNOSIS — Z7901 Long term (current) use of anticoagulants: Secondary | ICD-10-CM

## 2023-05-28 DIAGNOSIS — E782 Mixed hyperlipidemia: Secondary | ICD-10-CM

## 2023-05-28 DIAGNOSIS — I4892 Unspecified atrial flutter: Secondary | ICD-10-CM

## 2023-05-30 ENCOUNTER — Telehealth: Payer: Self-pay | Admitting: Cardiology

## 2023-05-30 NOTE — Progress Notes (Deleted)
  Cardiology Office Note:  .   Date:  05/30/2023  ID:  Francis Clayton, DOB [birth year], MRN [id number] PCP: Sherlie Ban, NP  [facility] Providers Cardiologist:  None { Click to update primary MD,subspecialty MD or APP then REFRESH:1}   History of Present Illness: .   Sang Degrazia is a 58 y.o. male with a past medical history of atrial flutter, tobacco abuse, aortic atherosclerosis, COPD  04/05/2023 echo EF 50-55%, RV moderately enlarged, LA moderately dilated and RA severely dilated, mild MR, trivial TR, PASP 40 mmHg  He was admitted to [facility] on 04/06/2023 to 04/08/2023 for atrial flutter RVR, he was starting on DOAC, some issues with hypotension.  Lab work was unrevealing, including TSH.  Evaluated by Dr. Dulce Sellar on 04/12/2023, his calcium channel blocker was reduced, plans to continue without interruption of his DOAC for 21 days and then proceed with DCCV.  Pete echo in a few months. Dccv  LDL was 143, normal LFTs.  ROS: ***  Studies Reviewed: .        *** Risk Assessment/Calculations:   {Does this patient have ATRIAL FIBRILLATION?:[id number]} No BP recorded.  {Refresh Note OR Click here to enter BP  :1}***       Physical Exam:   VS:  There were no vitals taken for this visit.   Wt Readings from Last 3 Encounters:  04/12/23 193 lb 9.6 oz (87.8 kg)  12/18/19 200 lb 3.2 oz (90.8 kg)  11/12/19 199 lb 11.2 oz (90.6 kg)    GEN: Well nourished, well developed in no acute distress NECK: No JVD; No carotid bruits CARDIAC: ***RRR, no murmurs, rubs, gallops RESPIRATORY:  Clear to auscultation without rales, wheezing or rhonchi  ABDOMEN: Soft, non-tender, non-distended EXTREMITIES:  No edema; No deformity   ASSESSMENT AND PLAN: .   ***    {Are you ordering a CV Procedure (e.g. stress test, cath, DCCV, TEE, etc)?   Press F2        :[id number]}  Dispo: ***  Signed, Flossie Dibble, NP

## 2023-05-30 NOTE — Telephone Encounter (Signed)
 Patient is requesting call back to discuss procedure he was told he would need prior to his next appt. Please advise.

## 2023-05-31 ENCOUNTER — Ambulatory Visit: Admitting: Cardiology

## 2023-05-31 NOTE — Telephone Encounter (Signed)
Attempted to call without success.

## 2023-05-31 NOTE — Telephone Encounter (Signed)
Call cannot be completed as dialed times 3.

## 2023-06-01 NOTE — Telephone Encounter (Signed)
 "  Call cannot be completed as dialed"

## 2023-06-04 NOTE — Telephone Encounter (Signed)
 Attempted to call the patient. Received a message that stated that "the call could not be completed as dialed".

## 2023-08-23 DIAGNOSIS — I499 Cardiac arrhythmia, unspecified: Secondary | ICD-10-CM | POA: Insufficient documentation

## 2023-08-23 DIAGNOSIS — K219 Gastro-esophageal reflux disease without esophagitis: Secondary | ICD-10-CM | POA: Insufficient documentation

## 2023-08-23 DIAGNOSIS — F17200 Nicotine dependence, unspecified, uncomplicated: Secondary | ICD-10-CM | POA: Insufficient documentation

## 2023-08-27 DIAGNOSIS — Z7901 Long term (current) use of anticoagulants: Secondary | ICD-10-CM | POA: Insufficient documentation

## 2023-08-27 DIAGNOSIS — E782 Mixed hyperlipidemia: Secondary | ICD-10-CM | POA: Insufficient documentation

## 2023-09-07 ENCOUNTER — Ambulatory Visit

## 2023-09-07 VITALS — BP 113/82 | HR 127 | Ht 76.0 in | Wt 186.0 lb

## 2023-09-07 DIAGNOSIS — I4892 Unspecified atrial flutter: Secondary | ICD-10-CM | POA: Insufficient documentation

## 2023-09-07 NOTE — Assessment & Plan Note (Addendum)
 Persistent atrial flutter, tachycardic CHA2DS2-VASc score 0. Despite tachycardia relatively asymptomatic. Currently on anticoagulation with Eliquis 5 mg twice daily consistently.  For rate control he is on diltiazem  180 mg once daily and metoprolol  succinate 100 mg twice daily.  Discussed with him importance of rate control and rhythm control in order to avoid long-term complications from tachycardia such as cardiomyopathy heart failure, stroke.  He is understandable and is willing to proceed with rhythm control as recommended.  Recommended he go to the ER if he is acutely symptomatic at any point of time.  Will schedule him for an expedited TEE cardioversion at [facility].  I would recommend TEE given his longstanding history of tachycardia unclear if he might have started showing signs of cardiomyopathy which can in turn increase the risk for clot formation in the atria.  Will also refer to electrophysiologist to establish care for future ablation options.

## 2023-09-07 NOTE — Progress Notes (Signed)
 Cardiology Consultation:    Date:  09/07/2023   ID:  Francis Clayton, DOB 1966/02/11, MRN [id number]  PCP:  Francis Layer, NP  Cardiologist:  Francis Evans Marishka Rentfrow, MD   Referring MD: Francis Layer, NP   Chief Complaint  Patient presents with   Follow-up     ASSESSMENT AND PLAN:   Francis Clayton 58 year old male with history of COPD, smoking tobacco, persistent atrial flutter with rapid ventricular rate, initially diagnosed January 2025 did not prefer inpatient cardioversion, subsequently was unable to schedule his outpatient cardioversion, now here for a follow-up visit remains persistently tachycardic in atrial flutter although with good functional status and minimal symptoms Problem List Items Addressed This Visit     Atrial flutter (HCC) - Primary   Persistent atrial flutter, tachycardic CHA2DS2-VASc score 0. Despite tachycardia relatively asymptomatic. Currently on anticoagulation with Eliquis 5 mg twice daily consistently.  For rate control he is on diltiazem  180 mg once daily and metoprolol  succinate 100 mg twice daily.  Discussed with him importance of rate control and rhythm control in order to avoid long-term complications from tachycardia such as cardiomyopathy heart failure, stroke.  He is understandable and is willing to proceed with rhythm control as recommended.  Recommended he go to the ER if he is acutely symptomatic at any point of time.  Will schedule him for an expedited TEE cardioversion at [facility].  I would recommend TEE given his longstanding history of tachycardia unclear if he might have started showing signs of cardiomyopathy which can in turn increase the risk for clot formation in the atria.  Will also refer to electrophysiologist to establish care for future ablation options.      Relevant Orders   EKG 12-Lead (Completed)   Ambulatory referral to Cardiac Electrophysiology   CBC with Differential/Platelet   Basic metabolic panel  with GFR   Return to clinic based on results from TEE cardioversion.   History of Present Illness:    Francis Clayton is a 58 y.o. male who is being seen today for follow-up visit PCP is Francis Layer, NP. Last visit with us  in the office was 04-12-2023 with Dr. Sandee Clayton. Have seen him for inpatient consult previously at [facility] 04-06-2023.  Has a history of persistent atrial flutter, COPD, smokes tobacco. Does not drink alcohol and no other history of substance abuse Lives by himself at home with his dog.  Continues to work at [employer].  He was initially diagnosed with atrial flutter in January 2025 when admitted at [facility].  Troponins at the time x 3 were unremarkable. Was offered cardioversion during inpatient stay in January at [facility] which he declined, subsequently was recommended this as outpatient after 3 weeks of anticoagulation. However this could not be scheduled as multiple attempts to reach him were unsuccessful.  He mentions his phone was not working for a few weeks.  Here now for follow-up visit. Mentions his heart rates have been elevated even at PCPs visits. Mentions he has been consistently taking his medications as prescribed.  Denies any chest pain, palpitations, lightheadedness, syncopal episodes. Mentions mildly tired after working all day. Denies any orthopnea or paroxysmal nocturnal dyspnea.  No pedal edema.  Denies any blood in urine or stools.  Echocardiogram done 04-05-2023 at [facility] reported atrial flutter during the study, LVEF 50 to 55%, normal RV function with moderately enlarged size, dilated left and right atria, mild MR, RVSP 40 mmHg   Past  Medical History:  Diagnosis Date   Acid reflux    Arthritis    Atrial flutter (HCC) 2025   Cellulitis of right ankle 09/27/2015   Cellulitis of right lower leg 09/29/2015   Closed fracture of one rib of left side 01/19/2017   COPD (chronic  obstructive pulmonary disease) (HCC)    Cracked tooth 11/12/2019   Encounter to establish care 11/12/2019   Gout    Hemoperitoneum 01/18/2017   Hernia, abdominal    History of COPD 11/12/2019   History of gastroesophageal reflux (GERD) 11/12/2019   Pain of right heel 11/12/2019   Sepsis (HCC) 09/27/2015   Spleen laceration 01/18/2017   Tobacco abuse    Viral hepatitis A without hepatic coma 05/21/2019    Past Surgical History:  Procedure Laterality Date   HERNIA REPAIR     I & D EXTREMITY Right 10/01/2015   Procedure: IRRIGATION AND DEBRIDEMENT EXTREMITY;  Surgeon: Timothy Ford, MD;  Location: [facility] OR;  Service: Orthopedics;  Laterality: Right;   SHOULDER SURGERY      Current Medications: Current Meds  Medication Sig   atorvastatin (LIPITOR) 20 MG tablet Take 20 mg by mouth at bedtime.   buPROPion (WELLBUTRIN XL) 150 MG 24 hr tablet Take 150 mg by mouth daily.   diltiazem  (CARDIZEM  CD) 180 MG 24 hr capsule Take 1 capsule (180 mg total) by mouth daily.   ELIQUIS 5 MG TABS tablet Take 5 mg by mouth 2 (two) times daily.   metoprolol  succinate (TOPROL -XL) 100 MG 24 hr tablet Take 1 tablet (100 mg total) by mouth 2 (two) times daily.   nicotine  (NICODERM CQ  - DOSED IN MG/24 HOURS) 14 mg/24hr patch Place 14 mg onto the skin daily.   nitroGLYCERIN (NITROSTAT) 0.4 MG SL tablet Place 0.4 mg under the tongue every 5 (five) minutes as needed for chest pain.   pantoprazole  (PROTONIX ) 40 MG tablet Take 1 tablet (40 mg total) by mouth daily.   SYMBICORT 160-4.5 MCG/ACT inhaler Inhale 2 puffs into the lungs 2 (two) times daily.     Allergies:   Patient has no known allergies.   Social History   Socioeconomic History   Marital status: Single    Spouse name: Not on file   Number of children: Not on file   Years of education: Not on file   Highest education level: Not on file  Occupational History   Not on file  Tobacco Use   Smoking status: Every Day    Current packs/day: 0.50     Types: Cigarettes   Smokeless tobacco: Never  Substance and Sexual Activity   Alcohol use: No   Drug use: No   Sexual activity: Not on file  Other Topics Concern   Not on file  Social History Narrative   Not on file   Social Drivers of Health   Financial Resource Strain: High Risk (05/26/2019)   Received from [facility] visits prior to 05/27/2022.   Overall Financial Resource Strain (CARDIA)    Difficulty of Paying Living Expenses: Very hard  Food Insecurity: Food Insecurity Present (05/26/2019)   Received from [facility] visits prior to 05/27/2022.   Hunger Vital Sign    Worried About Running Out of Food in the Last Year: Often true    Ran Out of Food in the Last Year: Sometimes true  Transportation Needs: Unmet Transportation Needs (05/26/2019)   Received from [facility] visits prior to 05/27/2022.  PRAPARE - Administrator, Civil Service (Medical): Yes    Lack of Transportation (Non-Medical): Yes  Physical Activity: Not on file  Stress: Not on file  Social Connections: Not on file     Family History: The patient's family history includes Cancer in his mother; Emphysema in his father; Parkinson's disease in his mother. ROS:   Please see the history of present illness.    All 14 point review of systems negative except as described per history of present illness.  EKGs/Labs/Other Studies Reviewed:    The following studies were reviewed today:   EKG:       Recent Labs: 04/12/2023: BUN 15; Creatinine, Ser 0.84; Hemoglobin 14.6; Platelets 378; Potassium 4.9; Sodium 141  Recent Lipid Panel    Component Value Date/Time   CHOL 279 (H) 11/12/2019 1053   TRIG 185 (H) 11/12/2019 1053   HDL 45 11/12/2019 1053   CHOLHDL 6.2 (H) 11/12/2019 1053   LDLCALC 199 (H) 11/12/2019 1053    Physical Exam:    VS:  BP 113/82   Pulse (!) 127   Ht 6' 4 (1.93 m)   Wt 186 lb (84.4 kg)   SpO2 95%   BMI 22.64 kg/m      Wt Readings from Last 3 Encounters:  09/07/23 186 lb (84.4 kg)  04/12/23 193 lb 9.6 oz (87.8 kg)  12/18/19 200 lb 3.2 oz (90.8 kg)     GENERAL:  Well nourished, well developed in no acute distress NECK: No JVD; No carotid bruits CARDIAC: Tachycardic, regular, S1 and S2 present, no murmurs, no rubs, no gallops CHEST:  Clear to auscultation without rales, wheezing or rhonchi  Extremities: No pitting pedal edema. Pulses bilaterally symmetric with radial 2+ and dorsalis pedis 2+ NEUROLOGIC:  Alert and oriented x 3  Medication Adjustments/Labs and Tests Ordered: Current medicines are reviewed at length with the patient today.  Concerns regarding medicines are outlined above.  Orders Placed This Encounter  Procedures   CBC with Differential/Platelet   Basic metabolic panel with GFR   Ambulatory referral to Cardiac Electrophysiology   EKG 12-Lead   No orders of the defined types were placed in this encounter.   Signed, Lura Sallies, MD, MPH, [facility]. 09/07/2023 1:38 PM    [facility]

## 2023-09-07 NOTE — Patient Instructions (Signed)
 Medication Instructions:  Your physician recommends that you continue on your current medications as directed. Please refer to the Current Medication list given to you today.  *If you need a refill on your cardiac medications before your next appointment, please call your pharmacy*   Lab Work: Your physician recommends that you have a CBC and BMP today in the office.  If you have labs (blood work) drawn today and your tests are completely normal, you will receive your results only by: MyChart Message (if you have MyChart) OR A paper copy in the mail If you have any lab test that is abnormal or we need to change your treatment, we will call you to review the results.   Testing/Procedures:    Dear Francis Clayton  You are scheduled for a TEE (Transesophageal Echocardiogram) Guided Cardioversion on Thursday, June 19 with Dr. Ronell Coe.    DIET:  Nothing to eat or drink after midnight except a sip of water with medications (see medication instructions below)  MEDICATION INSTRUCTIONS: !!IF ANY NEW MEDICATIONS ARE STARTED AFTER TODAY, PLEASE NOTIFY YOUR PROVIDER AS SOON AS POSSIBLE!!  FYI: Medications such as Semaglutide (Ozempic, [place]), Tirzepatide (Mounjaro, Zepbound), Dulaglutide (Trulicity), etc (GLP1 agonists) AND Canagliflozin (Invokana), Dapagliflozin (Farxiga), Empagliflozin (Jardiance), Ertugliflozin (Steglatro), Bexagliflozin [employer]) or any combination with one of these drugs such as Invokamet (Canagliflozin/Metformin), Synjardy (Empagliflozin/Metformin), etc (SGLT2 inhibitors) must be held around the time of a procedure. This is not a comprehensive list of all of these drugs. Please review all of your medications and talk to your provider if you take any one of these. If you are not sure, ask your provider.        :1}Continue taking your anticoagulant (blood thinner): Apixaban (Eliquis).  You will need to continue this after your procedure until you are told by your provider  that it is safe to stop.     FYI:  For your safety, and to allow us  to monitor your vital signs accurately during the surgery/procedure we request: If you have artificial nails, gel coating, SNS etc, please have those removed prior to your surgery/procedure. Not having the nail coverings /polish removed may result in cancellation or delay of your surgery/procedure.  Your support person will be asked to wait in the waiting room during your procedure.  It is OK to have someone drop you off and come back when you are ready to be discharged.  You cannot drive after the procedure and will need someone to drive you home.  Bring your insurance cards.  *Special Note: Every effort is made to have your procedure done on time. Occasionally there are emergencies that occur at the hospital that may cause delays. Please be patient if a delay does occur.        Follow-Up: At [facility], you and your health needs are our priority.  As part of our continuing mission to provide you with exceptional heart care, we have created designated Provider Care Teams.  These Care Teams include your primary Cardiologist (physician) and Advanced Practice Providers (APPs -  Physician Assistants and Nurse Practitioners) who all work together to provide you with the care you need, when you need it.  We recommend signing up for the patient portal called MyChart.  Sign up information is provided on this After Visit Summary.  MyChart is used to connect with patients for Virtual Visits (Telemedicine).  Patients are able to view lab/test results, encounter notes, upcoming appointments, etc.  Non-urgent messages can be sent to your  provider as well.   To learn more about what you can do with MyChart, go to [email or web address].    Your next appointment:   Based on TEE/cardioversion   The format for your next appointment:   In Person  Provider:   Bertha Broad, MD    Other  Instructions none  Important Information About Sugar

## 2023-09-08 ENCOUNTER — Ambulatory Visit: Payer: Self-pay

## 2023-09-08 LAB — BASIC METABOLIC PANEL WITH GFR
BUN/Creatinine Ratio: 15 (ref 9–20)
BUN: 17 mg/dL (ref 6–24)
CO2: 19 mmol/L — ABNORMAL LOW (ref 20–29)
Calcium: 9.7 mg/dL (ref 8.7–10.2)
Chloride: 103 mmol/L (ref 96–106)
Creatinine, Ser: 1.14 mg/dL (ref 0.76–1.27)
Glucose: 74 mg/dL (ref 70–99)
Potassium: 4.8 mmol/L (ref 3.5–5.2)
Sodium: 139 mmol/L (ref 134–144)
eGFR: 75 mL/min/{1.73_m2} (ref >59)

## 2023-09-08 LAB — CBC WITH DIFFERENTIAL/PLATELET
Basophils Absolute: 0.1 10*3/uL (ref 0.0–0.2)
Basos: 1 %
EOS (ABSOLUTE): 0.2 10*3/uL (ref 0.0–0.4)
Eos: 2 %
Hematocrit: 50.3 % (ref 37.5–51.0)
Hemoglobin: 16.3 g/dL (ref 13.0–17.7)
Immature Grans (Abs): 0 10*3/uL (ref 0.0–0.1)
Immature Granulocytes: 0 %
Lymphocytes Absolute: 2.8 10*3/uL (ref 0.7–3.1)
Lymphs: 23 %
MCH: 30.9 pg (ref 26.6–33.0)
MCHC: 32.4 g/dL (ref 31.5–35.7)
MCV: 95 fL (ref 79–97)
Monocytes Absolute: 1 10*3/uL — ABNORMAL HIGH (ref 0.1–0.9)
Monocytes: 8 %
Neutrophils Absolute: 7.7 10*3/uL — ABNORMAL HIGH (ref 1.4–7.0)
Neutrophils: 66 %
Platelets: 411 10*3/uL (ref 150–450)
RBC: 5.28 x10E6/uL (ref 4.14–5.80)
RDW: 12.8 % (ref 11.6–15.4)
WBC: 11.9 10*3/uL — ABNORMAL HIGH (ref 3.4–10.8)

## 2023-09-13 DIAGNOSIS — I4892 Unspecified atrial flutter: Secondary | ICD-10-CM | POA: Diagnosis not present

## 2023-09-13 DIAGNOSIS — R001 Bradycardia, unspecified: Secondary | ICD-10-CM | POA: Diagnosis not present

## 2023-09-15 DIAGNOSIS — I340 Nonrheumatic mitral (valve) insufficiency: Secondary | ICD-10-CM | POA: Diagnosis not present

## 2023-09-19 ENCOUNTER — Telehealth: Payer: Self-pay

## 2023-09-19 ENCOUNTER — Telehealth: Payer: Self-pay | Admitting: Cardiology

## 2023-09-19 LAB — LAB REPORT - SCANNED: EGFR: 38

## 2023-09-19 NOTE — Telephone Encounter (Signed)
 Dawn from [facility] called and stated that patient was in office today complaining of chest pain, SOB, and bloated feeling since starting on his heart medications.  Please call patient to advise what he should do.

## 2023-09-19 NOTE — Telephone Encounter (Signed)
 Patient called in to say that it feels like a elephant is sitting on his chest. States that his oxygen level is very low. Patient is headed to the hospital now, because of how bad he is feeling. Please advis

## 2023-09-19 NOTE — Telephone Encounter (Signed)
 Pt states that he is already at [facility] for treatment.

## 2023-09-20 DIAGNOSIS — R0602 Shortness of breath: Secondary | ICD-10-CM | POA: Diagnosis not present

## 2023-09-20 DIAGNOSIS — I4892 Unspecified atrial flutter: Secondary | ICD-10-CM | POA: Diagnosis not present

## 2023-09-20 DIAGNOSIS — R42 Dizziness and giddiness: Secondary | ICD-10-CM | POA: Diagnosis not present

## 2023-09-20 DIAGNOSIS — E860 Dehydration: Secondary | ICD-10-CM | POA: Diagnosis not present

## 2023-09-20 DIAGNOSIS — N179 Acute kidney failure, unspecified: Secondary | ICD-10-CM

## 2023-09-20 LAB — BASIC METABOLIC PANEL WITH GFR: EGFR: 71

## 2023-09-20 NOTE — Telephone Encounter (Signed)
 Pt is currently admitted at [facility].

## 2023-09-21 DIAGNOSIS — I4892 Unspecified atrial flutter: Secondary | ICD-10-CM | POA: Diagnosis not present

## 2023-09-21 DIAGNOSIS — I959 Hypotension, unspecified: Secondary | ICD-10-CM | POA: Diagnosis not present

## 2023-09-21 DIAGNOSIS — R001 Bradycardia, unspecified: Secondary | ICD-10-CM | POA: Diagnosis not present

## 2023-09-21 DIAGNOSIS — I4891 Unspecified atrial fibrillation: Secondary | ICD-10-CM | POA: Diagnosis not present

## 2023-09-22 DIAGNOSIS — I361 Nonrheumatic tricuspid (valve) insufficiency: Secondary | ICD-10-CM

## 2023-09-22 DIAGNOSIS — I340 Nonrheumatic mitral (valve) insufficiency: Secondary | ICD-10-CM

## 2023-09-22 DIAGNOSIS — R001 Bradycardia, unspecified: Secondary | ICD-10-CM | POA: Diagnosis not present

## 2023-09-22 DIAGNOSIS — I4892 Unspecified atrial flutter: Secondary | ICD-10-CM | POA: Diagnosis not present

## 2023-09-22 DIAGNOSIS — I371 Nonrheumatic pulmonary valve insufficiency: Secondary | ICD-10-CM

## 2023-09-23 DIAGNOSIS — I4892 Unspecified atrial flutter: Secondary | ICD-10-CM | POA: Diagnosis not present

## 2023-09-23 DIAGNOSIS — I340 Nonrheumatic mitral (valve) insufficiency: Secondary | ICD-10-CM | POA: Diagnosis not present

## 2023-09-23 DIAGNOSIS — R001 Bradycardia, unspecified: Secondary | ICD-10-CM | POA: Diagnosis not present

## 2023-09-24 DIAGNOSIS — I4892 Unspecified atrial flutter: Secondary | ICD-10-CM | POA: Diagnosis not present

## 2023-09-24 DIAGNOSIS — R001 Bradycardia, unspecified: Secondary | ICD-10-CM | POA: Diagnosis not present

## 2023-09-24 DIAGNOSIS — I4510 Unspecified right bundle-branch block: Secondary | ICD-10-CM | POA: Diagnosis not present

## 2023-09-24 DIAGNOSIS — I444 Left anterior fascicular block: Secondary | ICD-10-CM | POA: Diagnosis not present

## 2023-09-24 DIAGNOSIS — I340 Nonrheumatic mitral (valve) insufficiency: Secondary | ICD-10-CM

## 2023-09-24 DIAGNOSIS — E785 Hyperlipidemia, unspecified: Secondary | ICD-10-CM | POA: Diagnosis not present

## 2023-10-08 ENCOUNTER — Ambulatory Visit

## 2023-10-08 VITALS — BP 108/78 | HR 57 | Resp 18 | Ht 76.5 in | Wt 192.0 lb

## 2023-10-08 DIAGNOSIS — I4892 Unspecified atrial flutter: Secondary | ICD-10-CM

## 2023-10-08 NOTE — Progress Notes (Signed)
   Nurse Visit   Date of Encounter: 10/08/2023 ID: Milan Swindle, DOB 24-May-1965, MRN [id number]  PCP:  Duwaine Burnard Amble, NP   [facility] Providers Cardiologist:  Madireddy Click to update primary MD,subspecialty MD or APP then REFRESH:1}     Visit Details   VS:  BP 108/78 (BP Location: Left Arm, Patient Position: Sitting, Cuff Size: Normal)   Pulse (!) 57   Resp 18   Ht 6' 4.5 (1.943 m)   Wt 192 lb (87.1 kg)   SpO2 98%   BMI 23.07 kg/m  , BMI Body mass index is 23.07 kg/m.  Wt Readings from Last 3 Encounters:  10/08/23 192 lb (87.1 kg)  09/07/23 186 lb (84.4 kg)  04/12/23 193 lb 9.6 oz (87.8 kg)     Reason for visit: EKG post cardioversion Performed today: Vitals, EKG, Provider consulted:Madireddy, and Education Changes (medications, testing, etc.) : no changes Length of Visit: 20 minutes    Medications Adjustments/Labs and Tests Ordered: Orders Placed This Encounter  Procedures   EKG 12-Lead   No orders of the defined types were placed in this encounter.    Signed, Melene Meissner, RN  10/08/2023 1:53 PM

## 2023-10-22 ENCOUNTER — Ambulatory Visit: Attending: Cardiology | Admitting: Cardiology

## 2023-10-22 ENCOUNTER — Encounter: Payer: Self-pay | Admitting: Cardiology

## 2023-10-22 VITALS — BP 102/78 | HR 62 | Ht 76.0 in | Wt 193.2 lb

## 2023-10-22 DIAGNOSIS — I4892 Unspecified atrial flutter: Secondary | ICD-10-CM | POA: Insufficient documentation

## 2023-10-22 NOTE — Patient Instructions (Signed)
 Medication Instructions:  Your physician recommends that you continue on your current medications as directed. Please refer to the Current Medication list given to you today.  *If you need a refill on your cardiac medications before your next appointment, please call your pharmacy*  Lab Work: None ordered  If you have any lab test that is abnormal or we need to change your treatment, we will call you to review the results.  Testing/Procedures: Your physician has recommended that you have an ablation. Catheter ablation is a medical procedure used to treat some cardiac arrhythmias (irregular heartbeats). During catheter ablation, a long, thin, flexible tube is put into a blood vessel in your groin (upper thigh), or neck. This tube is called an ablation catheter. It is then guided to your heart through the blood vessel. Radio frequency waves destroy small areas of heart tissue where abnormal heartbeats may cause an arrhythmia to start.   The office will call you to schedule this for October/November, once those schedules are opened   Follow-Up: At [facility], you and your health needs are our priority.  As part of our continuing mission to provide you with exceptional heart care, our providers are all part of one team.  This team includes your primary Cardiologist (physician) and Advanced Practice Providers or APPs (Physician Assistants and Nurse Practitioners) who all work together to provide you with the care you need, when you need it.  Your next appointment:   1 month(s) after your ablation  Provider:   You may see Will Camnitz or one of the following Advanced Practice Providers on your designated Care Team:   Charlies Arthur, PA-C Michael Andy Tillery, PA-C Daphne Barrack, NP    Thank you for choosing [facility]!!   Maeola Domino, RN [phone]   Other Instructions  Cardiac Ablation Cardiac ablation is a procedure to destroy (ablate) heart tissue that is sending  bad signals. These bad signals cause the heart to beat very fast or in a way that is not normal. Destroying some tissues can help make the heart rhythm normal. Tell your doctor about: Any allergies you have. All medicines you are taking. These include vitamins, herbs, eye drops, creams, and over-the-counter medicines. Any problems you or family members have had with anesthesia. Any bleeding problems you have. Any surgeries you have had. Any medical conditions you have. Whether you are pregnant or may be pregnant. What are the risks? Your doctor will talk with you about risks. These may include: Infection. Bruising and bleeding. Stroke or blood clots. Damage to nearby areas of your body. Allergies to medicines or dyes. Needing a pacemaker if the heart gets damaged. A pacemaker helps the heart beat normally. The procedure not working. What happens before the procedure? Medicines Ask your doctor about changing or stopping: Your normal medicines. Vitamins, herbs, and supplements. Over-the-counter medicines. Do not take aspirin or ibuprofen  unless you are told to. General instructions Follow instructions from your doctor about what you may eat and drink. If you will be going home right after the procedure, plan to have a responsible adult: Take you home from the hospital or clinic. You will not be allowed to drive. Care for you for the time you are told. Ask your doctor what steps will be taken to prevent the spread of germs. What happens during the procedure?  An IV tube will be put into one of your veins. You may be given: A sedative. This helps you relax. Anesthesia. This will: Numb certain  areas of your body. The skin on your neck or groin will be numbed. A cut (incision) will be made in your neck or groin. A needle will be put through the cut and into a large vein. The small, thin tube (catheter) will be put into the needle. The tube will be moved to your heart. A type of  X-ray (fluoroscopy) will be used to help guide the tube. It will also show constant images of the heart on a screen. Dye may be put through the tube. This helps your doctor see your heart. An electric current will be sent from the tube to destroy heart tissue in certain areas. The tube will be taken out. Pressure will be held on your cut. This helps stop bleeding. A bandage (dressing) will be put over your cut. The procedure may vary among doctors and hospitals. What happens after the procedure? You will be monitored until you leave the hospital or clinic. This includes checking your blood pressure, heart rate and rhythm, breathing rate, and blood oxygen level. Your cut will be checked for bleeding. You will need to lie still for a few hours. If your groin was used, you will need to keep your leg straight for a few hours after the small, thin tube is removed. This information is not intended to replace advice given to you by your health care provider. Make sure you discuss any questions you have with your health care provider. Document Revised: 08/30/2021 Document Reviewed: 08/30/2021 Elsevier Patient Education  2024 [employer].

## 2023-10-22 NOTE — Progress Notes (Signed)
  Electrophysiology Office Note:   Date:  10/22/2023  ID:  Francis Clayton, DOB 19-Jun-1965, MRN [id number]  Primary Cardiologist: None Primary Heart Failure: None Electrophysiologist: None      History of Present Illness:   Francis Clayton is a 58 y.o. male with h/o COPD, tobacco abuse, atrial flutter seen today for  for Electrophysiology evaluation of atrial flutter at the request of Sreedhar Madireddy.    He was diagnosed with atrial flutter January 2025 when he was admitted to [facility].  He was offered inpatient cardioversion which he declined.  He had multiple attempts to reach him for outpatient cardioversion but this was unsuccessful.  Echo at [facility] showed a normal ejection fraction with biatrial dilation.  Today, denies symptoms of palpitations, chest pain, dyspnea, orthopnea, PND, lower extremity edema, claudication, dizziness, presyncope, syncope, bleeding, or neurologic sequela. The patient is tolerating medications without difficulties.  Since being back in rhythm he has felt well.  When he was admitted, he had significant weakness, fatigue, shortness of breath.  He is now able to do all his daily activities.  He is on amiodarone.  Review of systems complete and found to be negative unless listed in HPI.   EP Information / Studies Reviewed:    EKG is ordered today. Personal review as below.        Risk Assessment/Calculations:    CHA2DS2-VASc Score = 0   This indicates a 0.2% annual risk of stroke. The patient's score is based upon:       Physical Exam:   VS:  BP 102/78   Pulse 62   Ht 6' 4 (1.93 m)   Wt 193 lb 3.2 oz (87.6 kg)   SpO2 94%   BMI 23.52 kg/m    Wt Readings from Last 3 Encounters:  10/22/23 193 lb 3.2 oz (87.6 kg)  10/08/23 192 lb (87.1 kg)  09/07/23 186 lb (84.4 kg)     GEN: Well nourished, well developed in no acute distress NECK: No JVD; No carotid bruits CARDIAC: Regular rate and rhythm, no murmurs, rubs, gallops RESPIRATORY:   Clear to auscultation without rales, wheezing or rhonchi  ABDOMEN: Soft, non-tender, non-distended EXTREMITIES:  No edema; No deformity   ASSESSMENT AND PLAN:    1.  Typical atrial flutter: On Eliquis, diltiazem , metoprolol , amiodarone.  He is feeling well currently, but would prefer these to be short-term medications.  He would prefer rhythm control as he felt quite poor in atrial flutter.  Caleb Decock plan for ablation.  Risks and benefits have been discussed.  He understands the risks and is agreed to the procedure.  Risk, benefits, and alternatives to EP study and radiofrequency/pulse field ablation for atrial flutter were also discussed in detail today. These risks include but are not limited to stroke, bleeding, vascular damage, tamponade, perforation, damage to the esophagus, lungs, and other structures, pulmonary vein stenosis, worsening renal function, and death. The patient understands these risk and wishes to proceed.    Follow up with Dr. Inocencio as usual post procedure  Signed, Rosan Calbert Gladis Inocencio, MD

## 2023-10-25 LAB — LAB REPORT - SCANNED
A1c: 5.2
EGFR: 99.9

## 2023-10-29 ENCOUNTER — Ambulatory Visit: Payer: Self-pay

## 2023-11-07 ENCOUNTER — Telehealth: Payer: Self-pay

## 2023-11-07 NOTE — Telephone Encounter (Signed)
 Left message for patient to call back to schedule an ablation with Dr. Inocencio.

## 2023-12-10 NOTE — Telephone Encounter (Signed)
 Pt does not wish to proceed with ablation.  States he is doing well, no issues from his atrial flutter. Pt is only taking Eliquis, no Amiodarone/rate controlling medications. Will forward to MD for review/advisement. Pt aware I will follow up in next couple weeks to let him know what MD advises. Patient verbalized understanding and agreeable to plan.

## 2023-12-12 NOTE — Telephone Encounter (Signed)
 Left message to call back   (Pt to follow up with Dr. Liborio and he can refer back to us  if needed at a later date)

## 2023-12-21 DIAGNOSIS — K4021 Bilateral inguinal hernia, without obstruction or gangrene, recurrent: Secondary | ICD-10-CM | POA: Insufficient documentation

## 2023-12-25 ENCOUNTER — Encounter: Payer: Self-pay | Admitting: *Deleted

## 2023-12-25 NOTE — Progress Notes (Unsigned)
  Cardiology Office Note   Date:  12/25/2023  ID:  Karam Williams, DOB 1966-01-18, MRN [id number] PCP: Duwaine Burnard Amble, NP  [facility] Providers Cardiologist:  Alean SAUNDERS Madireddy, MD Electrophysiologist:  Will Gladis Norton, MD { Click to update primary MD,subspecialty MD or APP then REFRESH:1}    History of Present Illness Jawaun Blumenfeld is a 58 y.o. male ***  ROS: ***  Studies Reviewed      *** Risk Assessment/Calculations {Does this patient have ATRIAL FIBRILLATION?:[phone]} No BP recorded.  {Refresh Note OR Click here to enter BP  :1}***       Physical Exam VS:  There were no vitals taken for this visit.       Wt Readings from Last 3 Encounters:  10/22/23 193 lb 3.2 oz (87.6 kg)  10/08/23 192 lb (87.1 kg)  09/07/23 186 lb (84.4 kg)    GEN: Well nourished, well developed in no acute distress NECK: No JVD; No carotid bruits CARDIAC: ***RRR, no murmurs, rubs, gallops RESPIRATORY:  Clear to auscultation without rales, wheezing or rhonchi  ABDOMEN: Soft, non-tender, non-distended EXTREMITIES:  No edema; No deformity   ASSESSMENT AND PLAN ***    {Are you ordering a CV Procedure (e.g. stress test, cath, DCCV, TEE, etc)?   Press F2        :[id number]}  Dispo: ***  Signed, Delon JAYSON Hoover, NP

## 2023-12-26 ENCOUNTER — Encounter: Payer: Self-pay | Admitting: Cardiology

## 2023-12-26 ENCOUNTER — Ambulatory Visit: Admitting: Cardiology

## 2023-12-26 VITALS — BP 108/82 | HR 97 | Resp 16 | Ht 76.0 in | Wt 193.6 lb

## 2023-12-26 DIAGNOSIS — I4892 Unspecified atrial flutter: Secondary | ICD-10-CM | POA: Diagnosis not present

## 2023-12-26 DIAGNOSIS — D6859 Other primary thrombophilia: Secondary | ICD-10-CM | POA: Diagnosis present

## 2023-12-26 DIAGNOSIS — J449 Chronic obstructive pulmonary disease, unspecified: Secondary | ICD-10-CM | POA: Diagnosis present

## 2023-12-26 NOTE — Patient Instructions (Signed)
 Medication Instructions:  Your physician recommends that you continue on your current medications as directed. Please refer to the Current Medication list given to you today.  *If you need a refill on your cardiac medications before your next appointment, please call your pharmacy*  Lab Work: Your physician recommends that you return for lab work in: Today for CBC, CMP and Direct LDL  If you have labs (blood work) drawn today and your tests are completely normal, you will receive your results only by: MyChart Message (if you have MyChart) OR A paper copy in the mail If you have any lab test that is abnormal or we need to change your treatment, we will call you to review the results.  Testing/Procedures: NONE  Follow-Up: At [facility], you and your health needs are our priority.  As part of our continuing mission to provide you with exceptional heart care, our providers are all part of one team.  This team includes your primary Cardiologist (physician) and Advanced Practice Providers or APPs (Physician Assistants and Nurse Practitioners) who all work together to provide you with the care you need, when you need it.  Your next appointment:   6 month(s)  Provider:   Redell Leiter, MD    We recommend signing up for the patient portal called MyChart.  Sign up information is provided on this After Visit Summary.  MyChart is used to connect with patients for Virtual Visits (Telemedicine).  Patients are able to view lab/test results, encounter notes, upcoming appointments, etc.  Non-urgent messages can be sent to your provider as well.   To learn more about what you can do with MyChart, go to [email or web address].   Other Instructions

## 2023-12-27 ENCOUNTER — Ambulatory Visit: Payer: Self-pay | Admitting: Cardiology

## 2023-12-27 LAB — COMPREHENSIVE METABOLIC PANEL WITH GFR
ALT: 9 IU/L (ref 0–44)
AST: 14 IU/L (ref 0–40)
Albumin: 4.3 g/dL (ref 3.8–4.9)
Alkaline Phosphatase: 111 IU/L (ref 47–123)
BUN/Creatinine Ratio: 15 (ref 9–20)
BUN: 15 mg/dL (ref 6–24)
Bilirubin Total: 0.3 mg/dL (ref 0.0–1.2)
CO2: 21 mmol/L (ref 20–29)
Calcium: 9.5 mg/dL (ref 8.7–10.2)
Chloride: 102 mmol/L (ref 96–106)
Creatinine, Ser: 1.01 mg/dL (ref 0.76–1.27)
Globulin, Total: 2.6 g/dL (ref 1.5–4.5)
Glucose: 79 mg/dL (ref 70–99)
Potassium: 4.8 mmol/L (ref 3.5–5.2)
Sodium: 137 mmol/L (ref 134–144)
Total Protein: 6.9 g/dL (ref 6.0–8.5)
eGFR: 87 mL/min/1.73

## 2023-12-27 LAB — CBC WITH DIFFERENTIAL/PLATELET
Basophils Absolute: 0.1 x10E3/uL (ref 0.0–0.2)
Basos: 1 %
EOS (ABSOLUTE): 0.2 x10E3/uL (ref 0.0–0.4)
Eos: 2 %
Hematocrit: 46.3 % (ref 37.5–51.0)
Hemoglobin: 15.6 g/dL (ref 13.0–17.7)
Immature Grans (Abs): 0.1 x10E3/uL (ref 0.0–0.1)
Immature Granulocytes: 1 %
Lymphocytes Absolute: 2.2 x10E3/uL (ref 0.7–3.1)
Lymphs: 17 %
MCH: 31.4 pg (ref 26.6–33.0)
MCHC: 33.7 g/dL (ref 31.5–35.7)
MCV: 93 fL (ref 79–97)
Monocytes Absolute: 1.1 x10E3/uL — ABNORMAL HIGH (ref 0.1–0.9)
Monocytes: 9 %
Neutrophils Absolute: 8.9 x10E3/uL — ABNORMAL HIGH (ref 1.4–7.0)
Neutrophils: 70 %
Platelets: 429 x10E3/uL (ref 150–450)
RBC: 4.97 x10E6/uL (ref 4.14–5.80)
RDW: 14.4 % (ref 11.6–15.4)
WBC: 12.5 x10E3/uL — ABNORMAL HIGH (ref 3.4–10.8)

## 2023-12-27 LAB — LDL CHOLESTEROL, DIRECT: LDL Direct: 74 mg/dL (ref 0–99)

## 2023-12-28 ENCOUNTER — Other Ambulatory Visit: Payer: Self-pay

## 2023-12-28 DIAGNOSIS — E782 Mixed hyperlipidemia: Secondary | ICD-10-CM

## 2023-12-28 MED ORDER — ATORVASTATIN CALCIUM 20 MG PO TABS: 20.0000 mg | ORAL_TABLET | Freq: Every day | ORAL | 3 refills | Status: AC

## 2024-01-17 ENCOUNTER — Telehealth (HOSPITAL_BASED_OUTPATIENT_CLINIC_OR_DEPARTMENT_OTHER): Payer: Self-pay

## 2024-01-17 NOTE — Telephone Encounter (Signed)
   Pre-operative Risk Assessment    Patient Name: Francis Clayton  DOB: 11/22/1965 MRN: [id number]   Date of last office visit: 12/26/23 [place]  Date of next office visit: NA   Request for Surgical Clearance    Procedure:  Hernia surgery  Date of Surgery:  Clearance TBD                                 Surgeon:  Dr. Deward Foy Surgeon's Group or Practice Name:  [facility] Phone number:  [phone] Fax number:  [phone] attn : Hollie   Type of Clearance Requested:   - Medical  - Pharmacy:  Hold Apixaban (Eliquis) not indicated   Type of Anesthesia:  General    Additional requests/questions:    Bonney Augustin JONETTA Delores   01/17/2024, 11:19 AM

## 2024-01-18 ENCOUNTER — Telehealth (HOSPITAL_BASED_OUTPATIENT_CLINIC_OR_DEPARTMENT_OTHER): Payer: Self-pay

## 2024-01-21 NOTE — Telephone Encounter (Signed)
 Opened in error

## 2024-01-24 NOTE — Telephone Encounter (Signed)
 Patient with diagnosis of aflutter on Eliquis for anticoagulation.    Procedure: Hernia surgery  Date of procedure: TBD   CHA2DS2-VASc Score = 0   This indicates a 0.2% annual risk of stroke. The patient's score is based upon: CHF History: 0 HTN History: 0 Diabetes History: 0 Stroke History: 0 Vascular Disease History: 0 Age Score: 0 Gender Score: 0       CrCl 100 ml/min Platelet count 429  Patient has not had an Afib/aflutter ablation in the last 3 months, DCCV within the last 4 weeks or a watchman implanted in the last 45 days   Per office protocol, patient can hold Eliquis for 2 days prior to procedure.    **This guidance is not considered finalized until pre-operative APP has relayed final recommendations.**

## 2024-01-24 NOTE — Telephone Encounter (Signed)
   Primary Cardiologist: Redell Leiter, MD  Chart reviewed as part of pre-operative protocol coverage. Given past medical history and time since last visit, based on ACC/AHA guidelines, Jamell Caley would be at acceptable risk for the planned procedure without further cardiovascular testing.   Patient should contact our office if he is having new symptoms that are concerning from a cardiac perspective to arrange a follow-up appointment.    Per office protocol, patient can hold Eliquis for 2 days prior to procedure.    I will route this recommendation to the requesting party via Epic fax function and remove from pre-op pool.  Please call with questions.  Rosaline EMERSON Bane, NP-C 01/24/2024, 9:07 AM [address] [place], [place] [address] Office [phone] Fax [phone]

## 2024-02-19 ENCOUNTER — Other Ambulatory Visit (HOSPITAL_BASED_OUTPATIENT_CLINIC_OR_DEPARTMENT_OTHER): Payer: Self-pay | Admitting: Family

## 2024-02-19 DIAGNOSIS — Z87891 Personal history of nicotine dependence: Secondary | ICD-10-CM

## 2024-02-26 ENCOUNTER — Ambulatory Visit (HOSPITAL_BASED_OUTPATIENT_CLINIC_OR_DEPARTMENT_OTHER): Admitting: Radiology

## 2024-02-26 ENCOUNTER — Ambulatory Visit: Admitting: Podiatry

## 2024-02-26 ENCOUNTER — Encounter: Payer: Self-pay | Admitting: Podiatry

## 2024-02-26 ENCOUNTER — Ambulatory Visit

## 2024-02-26 DIAGNOSIS — M19071 Primary osteoarthritis, right ankle and foot: Secondary | ICD-10-CM | POA: Diagnosis not present

## 2024-02-26 DIAGNOSIS — M7752 Other enthesopathy of left foot: Secondary | ICD-10-CM

## 2024-02-26 DIAGNOSIS — M2041 Other hammer toe(s) (acquired), right foot: Secondary | ICD-10-CM

## 2024-02-26 DIAGNOSIS — M2042 Other hammer toe(s) (acquired), left foot: Secondary | ICD-10-CM

## 2024-02-26 DIAGNOSIS — M2022 Hallux rigidus, left foot: Secondary | ICD-10-CM

## 2024-02-26 DIAGNOSIS — M19072 Primary osteoarthritis, left ankle and foot: Secondary | ICD-10-CM

## 2024-02-26 DIAGNOSIS — M2021 Hallux rigidus, right foot: Secondary | ICD-10-CM

## 2024-02-26 MED ORDER — METHYLPREDNISOLONE 4 MG PO TBPK: ORAL_TABLET | ORAL | 0 refills | Status: AC

## 2024-02-26 NOTE — Progress Notes (Unsigned)
 Subjective:  Patient ID: Francis Clayton, male    DOB: 04-04-1965,  MRN: [id number]  Chief Complaint  Patient presents with   Foot Pain    Bilateral foot pain with arthritis. Been hurting for several months. Not tried any treatment except Ibuprofen , feel like lightning hitting his feet.  Not diabetic  Eliquis,       Discussed the use of AI scribe software for clinical note transcription with the patient, who gave verbal consent to proceed.  History of Present Illness Francis Clayton is a 58 year old male who presents with chronic foot pain and arthritis primarily affecting the big toe joints..  He has had several years of chronic foot pain, mainly in the big toe joints, worsened by prolonged walking and shoe wear, especially steel-toed boots. He describes sharp, shooting pain after long periods on his feet.  The right big toe joint has minimal motion with marked pain on bending. The left big toe joint has some motion with pain. He has prior traumatic injuries to his toes, including horse-related trauma and a forklift injury that fractured the three middle toes on the left foot.  He is on Eliquis for atrial flutter, so use of long-term anti-inflammatory medications is limited.  He smokes about one pack every four days and previously worked long hours standing on concrete floors in steel-toed boots.      Objective:    Physical Exam EXTREMITIES: Good muscle strength with plantar flexion, dorsiflexion, inversion and eversion of both lower extremities bilaterally.  Right foot hallux rigidus noted left foot hallux limitus noted.  Minimal dorsiflexion or plantarflexion available right first MPJ, less than 15 degrees dorsiflexion noted left first MPJ.  Palpable spurring dorsal aspect of the first metatarsal heads. Left second toe interdigital callus medial PIPJ level associated with slight first toe drift and inflammatory capsulitis. Mild hammertoe contracture noted with 4th and 5th toes most  affected.  Slight lateral drift of 2nd and 3rd toes at DIPJ. DP and PT pulses palpable 2/4 bilaterally.  Cap refill intact to the digits. Pincer callus present plantar 4th and 5th toes bilaterally. Light touch sensation intact   No images are attached to the encounter.    Results RADIOLOGY Left and right foot radiographs 3 views weightbearing 02/26/2024:  Right foot: End-stage first MPJ arthritis with loss of joint space noted.  Significant periarticular spurring.  Evidence of pseudo ankylosis present.  Flattening of the metatarsal head noted.  Pes planus foot type noted. Left foot: Significant first MPJ arthritis noted with decreased joint space.  Flattening of the metatarsal head noted.  Periarticular spurring.  Dorsal flag sign.  Joint mouse present.  Overall less significant than right foot though is considerable.  Pes planus foot type with evidence of TN spurring.  Slight valgus drift of left first toe   Assessment:   1. Hallux rigidus of both feet   2. Hammer toes of both feet      Plan:  Patient was evaluated and treated and all questions answered.  Assessment and Plan Assessment & Plan Bilateral hallux rigidus Chronic bilateral hallux rigidus with significant arthritis in the first metatarsophalangeal joints, more advanced on the right side. X-rays show little to no joint space and periarticular spurring, significant hallux rigidus right side left foot hallux limitus with limited dorsiflexion and plantar flexion, with the right toe having almost no motion. Pain exacerbated by prolonged walking and relieved by rest. On Eliquis, limiting the use of long-term anti-inflammatory medications. - Medrol Dosepak sent to  patient's pharmacy steroid taper to manage pain and inflammation. - Recommended wearing shoes with a stiff toe box to limit toe motion. - Advised using over-the-counter insoles with a carbon fiber Morton's extension to reduce toe joint motion. - Discussed potential for  steroid injections if oral steroids are insufficient.  These were offered to the patient today, deferred - Educated on avoiding walking barefoot to prevent aggravation of symptoms. - We did briefly discuss surgical intervention, will proceed with conservative management at this time  Bilateral hammer toes Chronic bilateral hammer toes with associated pain and callus formation, more pronounced on the left foot, second toe interdigital aspect. Pain exacerbated by prolonged walking and relieved by rest. Calluses form due to swelling and pressure, especially after extensive walking. - Recommended wearing shoes with a stiff toe box to limit toe motion. -Discussed home care techniques for management of the calluses.  Recommend use of toe spacers.  Toe spacers dispensed to patient. - Advised using over-the-counter insoles with a carbon fiber extension to reduce toe joint motion. - Educated on avoiding walking barefoot to prevent aggravation of symptoms.    Return if symptoms worsen or fail to improve, for hallux limitus.

## 2024-02-26 NOTE — Patient Instructions (Signed)
 Shoe recommendations for hallux limitus: Specific shoe models that limit excessive first toe strain include Brooks glycerin max, Hoka Clifton 9 and ASICS gel nimbus 25  Look for over-the-counter Morton's extension carbon fiber insoles to help limit motion at the big toe joints.  More silicone pads can be purchased from:  [email or web address].com/retail/  Look for silicone or foam toe spacers and toe separators as well as different unmedicated corn and callus pads.  You can also find these on Amazon or at some drugstores.

## 2024-03-10 NOTE — Progress Notes (Signed)
 Surgery orders requested via Epic inbox.

## 2024-03-11 ENCOUNTER — Ambulatory Visit: Payer: Self-pay | Admitting: Surgery

## 2024-03-11 NOTE — Progress Notes (Signed)
 Patient's ride never showed up. Completed interview over the phone. Patient coming in for labs 03/17/24 at 1330.  Date of COVID positive in last 90 days:  PCP - Burnard Idell Rigg, NP Cardiologist - Redell Leiter, MD Electrophysiologist- Soyla Norton, MD  Cardiac clearance by Rosaline Bane 01/24/24 in Epic  Chest x-ray - N/A EKG - 12/26/23 Epic Stress Test - N/A ECHO - 09/22/23 Epic Cardiac Cath - N/A Pacemaker/ICD device last checked:N/A Spinal Cord Stimulator:N/A  Bowel Prep - N/A  Sleep Study - N/A CPAP -   Fasting Blood Sugar - N/A Checks Blood Sugar _____ times a day  Last dose of GLP1 agonist-  N/A GLP1 instructions:  Do not take after     Last dose of SGLT-2 inhibitors-  N/A SGLT-2 instructions:  Do not take after     Blood Thinner Instructions: Eliquis, hold 2 days Aspirin Instructions:N/A Last Dose: 04/02/23 2100  Activity level: Can go up a flight of stairs and perform activities of daily living without stopping and without symptoms of chest pain or shortness of breath.   Anesthesia review: cardiac arrhythmia, a flutter, COPD, CP  Patient denies shortness of breath, fever, cough and chest pain at PAT appointment  Patient verbalized understanding of instructions that were given to them at the PAT appointment. Patient was also instructed that they will need to review over the PAT instructions again at home before surgery.

## 2024-03-12 ENCOUNTER — Encounter (HOSPITAL_COMMUNITY): Admission: RE | Admit: 2024-03-12 | Discharge: 2024-03-12 | Attending: Surgery

## 2024-03-12 ENCOUNTER — Other Ambulatory Visit: Payer: Self-pay

## 2024-03-12 ENCOUNTER — Encounter (HOSPITAL_COMMUNITY): Payer: Self-pay

## 2024-03-12 DIAGNOSIS — I10 Essential (primary) hypertension: Secondary | ICD-10-CM

## 2024-03-12 NOTE — Patient Instructions (Addendum)
 SURGICAL WAITING ROOM VISITATION  Patients having surgery or a procedure may have no more than 2 support people in the waiting area - these visitors may rotate.    Children ages 63 and under will not be able to visit patients in [facility] under most circumstances.   Visitors with respiratory illnesses are discouraged from visiting and should remain at home.  If the patient needs to stay at the hospital during part of their recovery, the visitor guidelines for inpatient rooms apply. Pre-op nurse will coordinate an appropriate time for 1 support person to accompany patient in pre-op.  This support person may not rotate.    Please refer to the [facility] website for the visitor guidelines for Inpatients (after your surgery is over and you are in a regular room).    Your procedure is scheduled on: 04/05/23   Report to [facility] Main Entrance    Report to admitting at 5:15 AM   Call this number if you have problems the morning of surgery [phone]   Do not eat food :After Midnight.   After Midnight you may have the following liquids until 4:30 AM DAY OF SURGERY  Water Non-Citrus Juices (without pulp, NO RED-Apple, White grape, White cranberry) Black Coffee (NO MILK/CREAM OR CREAMERS, sugar ok)  Clear Tea (NO MILK/CREAM OR CREAMERS, sugar ok) regular and decaf                             Plain Jell-O (NO RED)                                           Fruit ices (not with fruit pulp, NO RED)                                     Popsicles (NO RED)                                                               Sports drinks like Gatorade (NO RED)                     If you have questions, please contact your surgeons office.   FOLLOW BOWEL PREP AND ANY ADDITIONAL PRE OP INSTRUCTIONS YOU RECEIVED FROM YOUR SURGEON'S OFFICE!!!     Oral Hygiene is also important to reduce your risk of infection.                                    Remember - BRUSH YOUR TEETH THE  MORNING OF SURGERY WITH YOUR REGULAR TOOTHPASTE  DENTURES WILL BE REMOVED PRIOR TO SURGERY PLEASE DO NOT APPLY Poly grip OR ADHESIVES!!!   Do NOT smoke after Midnight   Stop all vitamins and herbal supplements 7 days before surgery.   Do not take Eliquis after 04/02/23.   Take these medicines the morning of surgery with A SIP OF WATER: Cetirizine, Atorvastatin , Pantoprazole , Inhaler  You may not have any metal on your body including jewelry, and body piercing             Do not wear lotions, powders, cologne, or deodorant              Men may shave face and neck.   Do not bring valuables to the hospital. [facility] IS NOT             RESPONSIBLE   FOR VALUABLES.   Contacts, glasses, dentures or bridgework may not be worn into surgery.  DO NOT BRING YOUR HOME MEDICATIONS TO THE HOSPITAL. PHARMACY WILL DISPENSE MEDICATIONS LISTED ON YOUR MEDICATION LIST TO YOU DURING YOUR ADMISSION IN THE HOSPITAL!    Patients discharged on the day of surgery will not be allowed to drive home.  Someone NEEDS to stay with you for the first 24 hours after anesthesia              Please read over the following fact sheets you were given: IF YOU HAVE QUESTIONS ABOUT YOUR PRE-OP INSTRUCTIONS PLEASE CALL [phone].   If you received a COVID test during your pre-op visit  it is requested that you wear a mask when out in public, stay away from anyone that may not be feeling well and notify your surgeon if you develop symptoms. If you test positive for Covid or have been in contact with anyone that has tested positive in the last 10 days please notify you surgeon.  [facility] - Preparing for Surgery Before surgery, you can play an important role.  Because skin is not sterile, your skin needs to be as free of germs as possible.  You can reduce the number of germs on your skin by washing with CHG (chlorahexidine gluconate) soap before surgery.  CHG is an antiseptic  cleaner which kills germs and bonds with the skin to continue killing germs even after washing. Please DO NOT use if you have an allergy to CHG or antibacterial soaps.  If your skin becomes reddened/irritated stop using the CHG and inform your nurse when you arrive at Short Stay. Do not shave (including legs and underarms) for at least 48 hours prior to the first CHG shower.  You may shave your face/neck.  Please follow these instructions carefully:  1.  Shower with CHG Soap the night before surgery ONLY (DO NOT USE THE SOAP THE MORNING OF SURGERY).  2.  If you choose to wash your hair, wash your hair first as usual with your normal  shampoo.  3.  After you shampoo, rinse your hair and body thoroughly to remove the shampoo.                             4.  Use CHG as you would any other liquid soap.  You can apply chg directly to the skin and wash.  Gently with a scrungie or clean washcloth.  5.  Apply the CHG Soap to your body ONLY FROM THE NECK DOWN.   Do   not use on face/ open                           Wound or open sores. Avoid contact with eyes, ears mouth and   genitals (private parts).  Wash face,  Genitals (private parts) with your normal soap.             6.  Wash thoroughly, paying special attention to the area where your    surgery  will be performed.  7.  Thoroughly rinse your body with warm water from the neck down.  8.  DO NOT shower/wash with your normal soap after using and rinsing off the CHG Soap.                9.  Pat yourself dry with a clean towel.            10.  Wear clean pajamas.            11.  Place clean sheets on your bed the night of your first shower and do not  sleep with pets. Day of Surgery : Do not apply any CHG, lotions/deodorants the morning of surgery.  Please wear clean clothes to the hospital/surgery center.  FAILURE TO FOLLOW THESE INSTRUCTIONS MAY RESULT IN THE CANCELLATION OF YOUR SURGERY  PATIENT  SIGNATURE_________________________________  NURSE SIGNATURE__________________________________  ________________________________________________________________________

## 2024-03-17 ENCOUNTER — Encounter (HOSPITAL_COMMUNITY): Admission: RE | Admit: 2024-03-17

## 2024-03-25 ENCOUNTER — Encounter (HOSPITAL_COMMUNITY): Admission: RE | Admit: 2024-03-25

## 2024-03-28 ENCOUNTER — Encounter (HOSPITAL_COMMUNITY)
Admission: RE | Admit: 2024-03-28 | Discharge: 2024-03-28 | Disposition: A | Discharge status: Home / Self Care | Source: Ambulatory Visit | Attending: Surgery | Admitting: Surgery

## 2024-03-28 DIAGNOSIS — I10 Essential (primary) hypertension: Secondary | ICD-10-CM | POA: Insufficient documentation

## 2024-03-28 DIAGNOSIS — Z01812 Encounter for preprocedural laboratory examination: Secondary | ICD-10-CM | POA: Diagnosis present

## 2024-03-28 LAB — CBC
HCT: 46.9 % (ref 39.0–52.0)
Hemoglobin: 15.8 g/dL (ref 13.0–17.0)
MCH: 30.7 pg (ref 26.0–34.0)
MCHC: 33.7 g/dL (ref 30.0–36.0)
MCV: 91.1 fL (ref 80.0–100.0)
Platelets: 322 K/uL (ref 150–400)
RBC: 5.15 MIL/uL (ref 4.22–5.81)
RDW: 12.9 % (ref 11.5–15.5)
WBC: 9.2 K/uL (ref 4.0–10.5)
nRBC: 0 % (ref 0.0–0.2)

## 2024-03-28 LAB — BASIC METABOLIC PANEL WITH GFR
Anion gap: 10 (ref 5–15)
BUN: 15 mg/dL (ref 6–20)
CO2: 26 mmol/L (ref 22–32)
Calcium: 9.5 mg/dL (ref 8.9–10.3)
Chloride: 102 mmol/L (ref 98–111)
Creatinine, Ser: 0.97 mg/dL (ref 0.61–1.24)
GFR, Estimated: >60 mL/min
Glucose, Bld: 86 mg/dL (ref 70–99)
Potassium: 4.1 mmol/L (ref 3.5–5.1)
Sodium: 138 mmol/L (ref 135–145)

## 2024-03-31 ENCOUNTER — Encounter (HOSPITAL_COMMUNITY): Payer: Self-pay

## 2024-03-31 NOTE — Addendum Note (Signed)
 Encounter addended by: Odis Burnard Jansky, PA-C on: 03/31/2024 2:53 PM  Actions taken: Clinical Note Signed, SmartForm saved

## 2024-03-31 NOTE — Anesthesia Preprocedure Evaluation (Addendum)
"                                    Anesthesia Evaluation  Patient identified by MRN, date of birth, ID band Patient awake    Reviewed: Allergy & Precautions, NPO status , Patient's Chart, lab work & pertinent test results  History of Anesthesia Complications Negative for: history of anesthetic complications  Airway Mallampati: II  TM Distance: >3 FB Neck ROM: Full    Dental  (+) Missing, Chipped, Poor Dentition   Pulmonary COPD, Current Smoker   Pulmonary exam normal        Cardiovascular Normal cardiovascular exam+ dysrhythmias (on Eliquis) Atrial Fibrillation   TTE 08/2023: EF 55-60%, mild RVE, mild LAE, moderate to severe RAE, mild MR/PR/TR, mild pHTN (RVSP )   Neuro/Psych negative neurological ROS     GI/Hepatic Neg liver ROS,GERD  ,,BILATERAL INGUINAL HERNIA   Endo/Other  negative endocrine ROS    Renal/GU negative Renal ROS     Musculoskeletal  (+) Arthritis ,    Abdominal   Peds  Hematology negative hematology ROS (+)   Anesthesia Other Findings   Reproductive/Obstetrics                              Anesthesia Physical Anesthesia Plan  ASA: 2  Anesthesia Plan: General   Post-op Pain Management: Tylenol  PO (pre-op)*   Induction: Intravenous  PONV Risk Score and Plan: Treatment may vary due to age or medical condition, Ondansetron , Dexamethasone  and Midazolam   Airway Management Planned: Oral ETT  Additional Equipment: None  Intra-op Plan:   Post-operative Plan: Extubation in OR  Informed Consent: I have reviewed the patients History and Physical, chart, labs and discussed the procedure including the risks, benefits and alternatives for the proposed anesthesia with the patient or authorized representative who has indicated his/her understanding and acceptance.     Dental advisory given  Plan Discussed with: CRNA  Anesthesia Plan Comments: (See PAT note from 12/17)         Anesthesia  Quick Evaluation  "

## 2024-03-31 NOTE — Progress Notes (Signed)
 " Case: [id number] Date/Time: 04/04/24 0715   Procedure: REPAIR, HERNIA, INGUINAL, LAPAROSCOPIC (Bilateral) - LAPAROSCOPIC BILATERAL INGUINAL HERNIA REPAIR WITH MESH   Anesthesia type: General   Pre-op diagnosis: BILATERAL INGUINAL HERNIA   Location: [facility] ROOM 01 / [facility]   Surgeons: Stechschulte, Deward PARAS, MD       DISCUSSION: Francis Clayton is a 58 male with PMH of every day smoking, atrial flutter on Eliquis, COPD, chronic DOE, GERD, arthritis.  Patient follows with Cardiology for A. Flutter on Eliquis. Patient last seen by NP Woody on 12/26/23. Noted to be stable and advised f/u in 6 months. Cleared for surgery in 10/30 telephone note:  Chart reviewed as part of pre-operative protocol coverage. Given past medical history and time since last visit, based on ACC/AHA guidelines, Francis Clayton would be at acceptable risk for the planned procedure without further cardiovascular testing.   Patient with hx of COPD. On Breztri inhaler.  VS:  Wt Readings from Last 3 Encounters:  12/26/23 87.8 kg  10/22/23 87.6 kg  10/08/23 87.1 kg   Temp Readings from Last 3 Encounters:  03/01/20 36.7 C (Oral)  08/09/18 37 C (Oral)  10/06/17 36.6 C (Oral)   BP Readings from Last 3 Encounters:  12/26/23 108/82  10/22/23 102/78  10/08/23 108/78   Pulse Readings from Last 3 Encounters:  12/26/23 97  10/22/23 62  10/08/23 (!) 57     PROVIDERS: Duwaine Burnard Amble, NP   LABS: Labs reviewed: Acceptable for surgery. (all labs ordered are listed, but only abnormal results are displayed)  Labs Reviewed - No data to display   IMAGES:   EKG 12/26/23:  NSR with sinus arrhythmia LAD Cannot r/o anterior infarct, age undetermined   Echo 09/22/23 ([facility]):  Normal left ventricular global systolic function with no regional segmental wall motion abnormality seen.  Ejection fraction is 65% Right ventricle is mildly enlarged Right ventricular global systolic function is normal The left atrium  is mildly dilated Right atrium is moderately to severely dilated There is mild mitral regurgitation There is mild pulmonic valve regurgitation Right ventricular systolic pressure is mildly elevated at 35 mmHg     Past Medical History:  Diagnosis Date   Acid reflux    Arthritis    Atrial flutter (HCC) 2025   Bilateral recurrent inguinal hernia 12/21/2023   Body mass index (BMI) 22.0-22.9, adult 04/03/2023   Body mass index [BMI] 22.0-22.9, adult     Cardiac arrhythmia 08/23/2023   Cellulitis of right ankle 09/27/2015   Cellulitis of right lower leg 09/29/2015   Chest pain 04/03/2023   Chest pain, unspecified     Closed fracture of one rib of left side 01/19/2017   COPD (chronic obstructive pulmonary disease) (HCC)    Cracked tooth 11/12/2019   Dyspnea 04/11/2023   Shortness of breathShortness of breath; Start Date : 04/03/2023     Electrocardiogram abnormal 04/03/2023   Abnormal electrocardiogram [ECG] [EKG]     Encounter to establish care 11/12/2019   Gastroesophageal reflux disease without esophagitis 08/23/2023   Gout    Hemoperitoneum 01/18/2017   Hernia, abdominal    History of COPD 11/12/2019   History of gastroesophageal reflux (GERD) 11/12/2019   Long term current use of anticoagulant therapy 08/27/2023   Mixed hyperlipidemia 08/27/2023   Pain of right heel 11/12/2019   Sepsis (HCC) 09/27/2015   Spleen laceration 01/18/2017   Tobacco abuse    Tobacco abuse counseling 04/11/2023   Tobacco abuse counseling     Viral  hepatitis A without hepatic coma 05/21/2019    Past Surgical History:  Procedure Laterality Date   CARDIOVERSION     x2   HERNIA REPAIR     when he was 5   I & D EXTREMITY Right 10/01/2015   Procedure: IRRIGATION AND DEBRIDEMENT EXTREMITY;  Surgeon: Jerona LULLA Sage, MD;  Location: [facility];  Service: Orthopedics;  Laterality: Right;   PRESSURE ULCER DEBRIDEMENT Right    to foot   SHOULDER SURGERY     TOE SURGERY     x3 toes    MEDICATIONS:   amiodarone (PACERONE) 200 MG tablet   atorvastatin  (LIPITOR) 20 MG tablet   Budeson-Glycopyrrol-Formoterol (BREZTRI AEROSPHERE IN)   buPROPion (WELLBUTRIN XL) 150 MG 24 hr tablet   cetirizine (ZYRTEC) 10 MG tablet   ELIQUIS 5 MG TABS tablet   methylPREDNISolone  (MEDROL  DOSEPAK) 4 MG TBPK tablet   neomycin-bacitracin-polymyxin (NEOSPORIN) OINT   nicotine  (NICODERM CQ  - DOSED IN MG/24 HOURS) 14 mg/24hr patch   nitroGLYCERIN (NITROSTAT) 0.4 MG SL tablet   pantoprazole  (PROTONIX ) 40 MG tablet   Pseudoeph-Doxylamine-DM-APAP (NYQUIL PO)   SYMBICORT 160-4.5 MCG/ACT inhaler   traZODone (DESYREL) 50 MG tablet   No current facility-administered medications for this encounter.   Burnard CHRISTELLA Odis DEVONNA [facility] Short Stay/Anesthesiology [facility] Phone [phone] 03/31/2024 2:51 PM        "

## 2024-04-04 ENCOUNTER — Other Ambulatory Visit: Payer: Self-pay

## 2024-04-04 ENCOUNTER — Encounter (HOSPITAL_COMMUNITY): Admission: RE | Disposition: A | Payer: Self-pay | Source: Home / Self Care | Attending: Surgery

## 2024-04-04 ENCOUNTER — Ambulatory Visit (HOSPITAL_COMMUNITY): Payer: Self-pay | Admitting: Medical

## 2024-04-04 ENCOUNTER — Observation Stay (HOSPITAL_COMMUNITY): Admission: RE | Admit: 2024-04-04 | Discharge: 2024-04-05 | Disposition: A | Attending: Surgery | Admitting: Surgery

## 2024-04-04 ENCOUNTER — Encounter (HOSPITAL_COMMUNITY): Payer: Self-pay | Admitting: Surgery

## 2024-04-04 ENCOUNTER — Ambulatory Visit (HOSPITAL_COMMUNITY): Admitting: Anesthesiology

## 2024-04-04 DIAGNOSIS — I4892 Unspecified atrial flutter: Secondary | ICD-10-CM | POA: Diagnosis not present

## 2024-04-04 DIAGNOSIS — J449 Chronic obstructive pulmonary disease, unspecified: Secondary | ICD-10-CM | POA: Insufficient documentation

## 2024-04-04 DIAGNOSIS — K4020 Bilateral inguinal hernia, without obstruction or gangrene, not specified as recurrent: Secondary | ICD-10-CM

## 2024-04-04 DIAGNOSIS — Z79891 Long term (current) use of opiate analgesic: Secondary | ICD-10-CM | POA: Diagnosis not present

## 2024-04-04 DIAGNOSIS — F17210 Nicotine dependence, cigarettes, uncomplicated: Secondary | ICD-10-CM | POA: Diagnosis not present

## 2024-04-04 DIAGNOSIS — K219 Gastro-esophageal reflux disease without esophagitis: Secondary | ICD-10-CM | POA: Insufficient documentation

## 2024-04-04 HISTORY — PX: INGUINAL HERNIA REPAIR: SHX194

## 2024-04-04 LAB — CBC
HCT: 41.3 % (ref 39.0–52.0)
Hemoglobin: 14.2 g/dL (ref 13.0–17.0)
MCH: 30.8 pg (ref 26.0–34.0)
MCHC: 34.4 g/dL (ref 30.0–36.0)
MCV: 89.6 fL (ref 80.0–100.0)
Platelets: 315 K/uL (ref 150–400)
RBC: 4.61 MIL/uL (ref 4.22–5.81)
RDW: 13.2 % (ref 11.5–15.5)
WBC: 16.5 K/uL — ABNORMAL HIGH (ref 4.0–10.5)
nRBC: 0 % (ref 0.0–0.2)

## 2024-04-04 LAB — CREATININE, SERUM
Creatinine, Ser: 0.86 mg/dL (ref 0.61–1.24)
GFR, Estimated: >60 mL/min

## 2024-04-04 SURGERY — REPAIR, HERNIA, INGUINAL, LAPAROSCOPIC
Anesthesia: General | Laterality: Bilateral

## 2024-04-04 MED ORDER — KETOROLAC TROMETHAMINE 30 MG/ML IJ SOLN
INTRAMUSCULAR | Status: DC | PRN
  Administered 2024-04-04: 30 mg via INTRAVENOUS

## 2024-04-04 MED ORDER — BUPIVACAINE-EPINEPHRINE (PF) 0.25% -1:200000 IJ SOLN
INTRAMUSCULAR | Status: AC
  Filled 2024-04-04: qty 30

## 2024-04-04 MED ORDER — FENTANYL CITRATE (PF) 100 MCG/2ML IJ SOLN
INTRAMUSCULAR | Status: AC
  Filled 2024-04-04: qty 2

## 2024-04-04 MED ORDER — ATORVASTATIN CALCIUM 20 MG PO TABS
40.0000 mg | ORAL_TABLET | Freq: Every morning | ORAL | Status: DC
  Administered 2024-04-04 – 2024-04-05 (×2): 40 mg via ORAL
  Filled 2024-04-04 (×2): qty 2

## 2024-04-04 MED ORDER — FENTANYL CITRATE (PF) 50 MCG/ML IJ SOSY
25.0000 ug | PREFILLED_SYRINGE | INTRAMUSCULAR | Status: DC | PRN
  Administered 2024-04-04 (×2): 50 ug via INTRAVENOUS

## 2024-04-04 MED ORDER — CHLORHEXIDINE GLUCONATE CLOTH 2 % EX PADS: 6.0000 | MEDICATED_PAD | Freq: Once | CUTANEOUS | Status: DC

## 2024-04-04 MED ORDER — OXYCODONE HCL 5 MG/5ML PO SOLN: 5.0000 mg | Freq: Once | ORAL | Status: DC | PRN

## 2024-04-04 MED ORDER — MIDAZOLAM HCL 5 MG/5ML IJ SOLN
INTRAMUSCULAR | Status: DC | PRN
  Administered 2024-04-04: 2 mg via INTRAVENOUS

## 2024-04-04 MED ORDER — CEFAZOLIN SODIUM-DEXTROSE 2-4 GM/100ML-% IV SOLN
2.0000 g | INTRAVENOUS | Status: AC
  Administered 2024-04-04: 2 g via INTRAVENOUS
  Filled 2024-04-04: qty 100

## 2024-04-04 MED ORDER — ROCURONIUM BROMIDE 100 MG/10ML IV SOLN
INTRAVENOUS | Status: DC | PRN
  Administered 2024-04-04: 10 mg via INTRAVENOUS
  Administered 2024-04-04: 60 mg via INTRAVENOUS

## 2024-04-04 MED ORDER — NICOTINE 14 MG/24HR TD PT24
14.0000 mg | MEDICATED_PATCH | Freq: Every day | TRANSDERMAL | Status: DC
  Administered 2024-04-04 – 2024-04-05 (×2): 14 mg via TRANSDERMAL
  Filled 2024-04-04 (×2): qty 1

## 2024-04-04 MED ORDER — ENOXAPARIN SODIUM 40 MG/0.4ML IJ SOSY
40.0000 mg | PREFILLED_SYRINGE | INTRAMUSCULAR | Status: DC
  Administered 2024-04-05: 40 mg via SUBCUTANEOUS
  Filled 2024-04-04: qty 0.4

## 2024-04-04 MED ORDER — FENTANYL CITRATE (PF) 100 MCG/2ML IJ SOLN
INTRAMUSCULAR | Status: DC | PRN
  Administered 2024-04-04: 50 ug via INTRAVENOUS
  Administered 2024-04-04: 100 ug via INTRAVENOUS
  Administered 2024-04-04 (×6): 50 ug via INTRAVENOUS

## 2024-04-04 MED ORDER — PROCHLORPERAZINE EDISYLATE 10 MG/2ML IJ SOLN: 10.0000 mg | INTRAMUSCULAR | Status: DC | PRN

## 2024-04-04 MED ORDER — LORATADINE 10 MG PO TABS
10.0000 mg | ORAL_TABLET | Freq: Every day | ORAL | Status: DC
  Administered 2024-04-04 – 2024-04-05 (×2): 10 mg via ORAL
  Filled 2024-04-04 (×2): qty 1

## 2024-04-04 MED ORDER — HEPARIN SODIUM (PORCINE) 5000 UNIT/ML IJ SOLN
5000.0000 [IU] | Freq: Once | INTRAMUSCULAR | Status: AC
  Administered 2024-04-04: 5000 [IU] via SUBCUTANEOUS
  Filled 2024-04-04: qty 1

## 2024-04-04 MED ORDER — OXYCODONE HCL 5 MG PO TABS: 5.0000 mg | ORAL_TABLET | Freq: Once | ORAL | Status: DC | PRN

## 2024-04-04 MED ORDER — SUGAMMADEX SODIUM 200 MG/2ML IV SOLN
INTRAVENOUS | Status: AC
  Filled 2024-04-04: qty 2

## 2024-04-04 MED ORDER — DOCUSATE SODIUM 100 MG PO CAPS
100.0000 mg | ORAL_CAPSULE | Freq: Two times a day (BID) | ORAL | Status: DC
  Administered 2024-04-04 – 2024-04-05 (×3): 100 mg via ORAL
  Filled 2024-04-04 (×3): qty 1

## 2024-04-04 MED ORDER — PROPOFOL 500 MG/50ML IV EMUL
INTRAVENOUS | Status: AC
  Filled 2024-04-04: qty 50

## 2024-04-04 MED ORDER — CHLORHEXIDINE GLUCONATE 0.12 % MT SOLN
15.0000 mL | Freq: Once | OROMUCOSAL | Status: AC
  Administered 2024-04-04: 15 mL via OROMUCOSAL

## 2024-04-04 MED ORDER — PANTOPRAZOLE SODIUM 40 MG PO TBEC
40.0000 mg | DELAYED_RELEASE_TABLET | Freq: Every day | ORAL | Status: DC
  Administered 2024-04-04 – 2024-04-05 (×2): 40 mg via ORAL
  Filled 2024-04-04 (×2): qty 1

## 2024-04-04 MED ORDER — BUPIVACAINE-EPINEPHRINE (PF) 0.25% -1:200000 IJ SOLN
INTRAMUSCULAR | Status: DC | PRN
  Administered 2024-04-04: 10 mL

## 2024-04-04 MED ORDER — GABAPENTIN 300 MG PO CAPS
300.0000 mg | ORAL_CAPSULE | Freq: Three times a day (TID) | ORAL | Status: DC
  Administered 2024-04-04 – 2024-04-05 (×3): 300 mg via ORAL
  Filled 2024-04-04: qty 1
  Filled 2024-04-04: qty 3
  Filled 2024-04-04: qty 1

## 2024-04-04 MED ORDER — SIMETHICONE 80 MG PO CHEW
80.0000 mg | CHEWABLE_TABLET | Freq: Four times a day (QID) | ORAL | Status: DC | PRN
  Administered 2024-04-05 (×2): 80 mg via ORAL
  Filled 2024-04-04 (×2): qty 1

## 2024-04-04 MED ORDER — OXYCODONE HCL 5 MG PO TABS: 10.0000 mg | ORAL_TABLET | ORAL | Status: DC | PRN

## 2024-04-04 MED ORDER — HYDROMORPHONE HCL 2 MG/ML IJ SOLN
INTRAMUSCULAR | Status: AC
  Filled 2024-04-04: qty 1

## 2024-04-04 MED ORDER — LACTATED RINGERS IV SOLN: INTRAVENOUS | Status: DC

## 2024-04-04 MED ORDER — HYDROMORPHONE HCL 1 MG/ML IJ SOLN: 1.0000 mg | INTRAMUSCULAR | Status: DC | PRN

## 2024-04-04 MED ORDER — TRAZODONE HCL 50 MG PO TABS
50.0000 mg | ORAL_TABLET | Freq: Every day | ORAL | Status: DC
  Administered 2024-04-04: 50 mg via ORAL
  Filled 2024-04-04: qty 1

## 2024-04-04 MED ORDER — 0.9 % SODIUM CHLORIDE (POUR BTL) OPTIME
TOPICAL | Status: DC | PRN
  Administered 2024-04-04: 1000 mL

## 2024-04-04 MED ORDER — ORAL CARE MOUTH RINSE: 15.0000 mL | Freq: Once | OROMUCOSAL | Status: AC

## 2024-04-04 MED ORDER — METHOCARBAMOL 1000 MG/10ML IJ SOLN: 500.0000 mg | Freq: Four times a day (QID) | INTRAMUSCULAR | Status: DC | PRN

## 2024-04-04 MED ORDER — DEXAMETHASONE SOD PHOSPHATE PF 10 MG/ML IJ SOLN
INTRAMUSCULAR | Status: DC | PRN
  Administered 2024-04-04: 5 mg via INTRAVENOUS

## 2024-04-04 MED ORDER — BUDESON-GLYCOPYRROL-FORMOTEROL 160-9-4.8 MCG/ACT IN AERO
2.0000 | INHALATION_SPRAY | Freq: Every day | RESPIRATORY_TRACT | Status: DC | PRN
  Filled 2024-04-04: qty 5.9

## 2024-04-04 MED ORDER — ACETAMINOPHEN 325 MG PO TABS
650.0000 mg | ORAL_TABLET | Freq: Four times a day (QID) | ORAL | Status: DC
  Filled 2024-04-04 (×2): qty 2

## 2024-04-04 MED ORDER — ONDANSETRON HCL 4 MG/2ML IJ SOLN
INTRAMUSCULAR | Status: AC
  Filled 2024-04-04: qty 2

## 2024-04-04 MED ORDER — ACETAMINOPHEN 500 MG PO TABS: 1000.0000 mg | ORAL_TABLET | ORAL | Status: DC

## 2024-04-04 MED ORDER — SUGAMMADEX SODIUM 200 MG/2ML IV SOLN
INTRAVENOUS | Status: DC | PRN
  Administered 2024-04-04: 200 mg via INTRAVENOUS

## 2024-04-04 MED ORDER — LIDOCAINE HCL (PF) 2 % IJ SOLN
INTRAMUSCULAR | Status: AC
  Filled 2024-04-04: qty 5

## 2024-04-04 MED ORDER — FENTANYL CITRATE (PF) 50 MCG/ML IJ SOSY
PREFILLED_SYRINGE | INTRAMUSCULAR | Status: AC
  Filled 2024-04-04: qty 3

## 2024-04-04 MED ORDER — ACETAMINOPHEN 500 MG PO TABS
1000.0000 mg | ORAL_TABLET | Freq: Once | ORAL | Status: DC
  Filled 2024-04-04: qty 2

## 2024-04-04 MED ORDER — OXYCODONE HCL 5 MG PO TABS
5.0000 mg | ORAL_TABLET | ORAL | Status: DC | PRN
  Administered 2024-04-04 – 2024-04-05 (×2): 5 mg via ORAL
  Filled 2024-04-04 (×2): qty 1

## 2024-04-04 MED ORDER — HYDROMORPHONE HCL 1 MG/ML IJ SOLN
INTRAMUSCULAR | Status: DC | PRN
  Administered 2024-04-04 (×2): .5 mg via INTRAVENOUS

## 2024-04-04 MED ORDER — MIDAZOLAM HCL 2 MG/2ML IJ SOLN
INTRAMUSCULAR | Status: AC
  Filled 2024-04-04: qty 2

## 2024-04-04 MED ORDER — NITROGLYCERIN 0.4 MG SL SUBL: 0.4000 mg | SUBLINGUAL_TABLET | SUBLINGUAL | Status: DC | PRN

## 2024-04-04 MED ORDER — ROCURONIUM BROMIDE 10 MG/ML (PF) SYRINGE
PREFILLED_SYRINGE | INTRAVENOUS | Status: AC
  Filled 2024-04-04: qty 10

## 2024-04-04 MED ORDER — LIDOCAINE HCL (CARDIAC) PF 100 MG/5ML IV SOSY
PREFILLED_SYRINGE | INTRAVENOUS | Status: DC | PRN
  Administered 2024-04-04: 100 mg via INTRAVENOUS

## 2024-04-04 MED ORDER — FENTANYL CITRATE (PF) 250 MCG/5ML IJ SOLN
INTRAMUSCULAR | Status: AC
  Filled 2024-04-04: qty 5

## 2024-04-04 MED ORDER — GABAPENTIN 300 MG PO CAPS
300.0000 mg | ORAL_CAPSULE | ORAL | Status: AC
  Administered 2024-04-04: 300 mg via ORAL
  Filled 2024-04-04: qty 1

## 2024-04-04 MED ORDER — DEXAMETHASONE SOD PHOSPHATE PF 10 MG/ML IJ SOLN
INTRAMUSCULAR | Status: AC
  Filled 2024-04-04: qty 1

## 2024-04-04 MED ORDER — ONDANSETRON HCL 4 MG/2ML IJ SOLN
INTRAMUSCULAR | Status: DC | PRN
  Administered 2024-04-04: 4 mg via INTRAVENOUS

## 2024-04-04 MED ORDER — PROPOFOL 10 MG/ML IV BOLUS
INTRAVENOUS | Status: DC | PRN
  Administered 2024-04-04: 50 mg via INTRAVENOUS
  Administered 2024-04-04: 180 mg via INTRAVENOUS
  Administered 2024-04-04: 20 mg via INTRAVENOUS

## 2024-04-04 MED ORDER — OXYCODONE HCL 5 MG PO TABS: 5.0000 mg | ORAL_TABLET | Freq: Three times a day (TID) | ORAL | 0 refills | Status: AC | PRN

## 2024-04-04 MED ORDER — DROPERIDOL 2.5 MG/ML IJ SOLN: 0.6250 mg | Freq: Once | INTRAMUSCULAR | Status: DC | PRN

## 2024-04-04 MED ORDER — ONDANSETRON HCL 4 MG/2ML IJ SOLN: 4.0000 mg | Freq: Four times a day (QID) | INTRAMUSCULAR | Status: DC | PRN

## 2024-04-04 SURGICAL SUPPLY — 30 items
BAG COUNTER SPONGE SURGICOUNT (BAG) ×1 IMPLANT
CHLORAPREP W/TINT 26 (MISCELLANEOUS) ×1 IMPLANT
COVER SURGICAL LIGHT HANDLE (MISCELLANEOUS) ×1 IMPLANT
DERMABOND ADVANCED .7 DNX12 (GAUZE/BANDAGES/DRESSINGS) ×1 IMPLANT
ELECT REM PT RETURN 15FT ADLT (MISCELLANEOUS) ×1 IMPLANT
GLOVE BIO SURGEON STRL SZ7.5 (GLOVE) ×1 IMPLANT
GLOVE INDICATOR 8.0 STRL GRN (GLOVE) ×1 IMPLANT
GOWN STRL REUS W/ TWL XL LVL3 (GOWN DISPOSABLE) ×1 IMPLANT
GRASPER SUT TROCAR 14GX15 (MISCELLANEOUS) ×1 IMPLANT
IRRIGATION SUCT STRKRFLW 2 WTP (MISCELLANEOUS) IMPLANT
KIT BASIN OR (CUSTOM PROCEDURE TRAY) ×1 IMPLANT
KIT TURNOVER KIT A (KITS) ×1 IMPLANT
MARKER SKIN DUAL TIP RULER LAB (MISCELLANEOUS) ×1 IMPLANT
MESH 3DMAX 5X7 LT XLRG (Mesh General) IMPLANT
MESH 3DMAX 5X7 RT XLRG (Mesh General) IMPLANT
NEEDLE INSUFFLATION 14GA 120MM (NEEDLE) ×1 IMPLANT
RELOAD STAPLE 4.0 BLU F/HERNIA (INSTRUMENTS) ×1 IMPLANT
RELOAD STAPLE 4.8 BLK F/HERNIA (STAPLE) IMPLANT
SCISSORS LAP 5X35 DISP (ENDOMECHANICALS) ×1 IMPLANT
SET TUBE SMOKE EVAC HIGH FLOW (TUBING) ×1 IMPLANT
SLEEVE Z-THREAD 5X100MM (TROCAR) ×1 IMPLANT
SPIKE FLUID TRANSFER (MISCELLANEOUS) IMPLANT
STAPLER HERNIA 12 8.5 360D (INSTRUMENTS) ×1 IMPLANT
SUT MNCRL AB 4-0 PS2 18 (SUTURE) ×1 IMPLANT
SUT VICRYL 0 UR6 27IN ABS (SUTURE) ×1 IMPLANT
TOWEL OR DSP ST BLU DLX 10/PK (DISPOSABLE) ×1 IMPLANT
TRAY FOL W/BAG SLVR 16FR STRL (SET/KITS/TRAYS/PACK) IMPLANT
TRAY LAPAROSCOPIC (CUSTOM PROCEDURE TRAY) ×1 IMPLANT
TROCAR ADV FIXATION 12X100MM (TROCAR) ×1 IMPLANT
TROCAR Z-THREAD FIOS 5X100MM (TROCAR) ×1 IMPLANT

## 2024-04-04 NOTE — Anesthesia Procedure Notes (Signed)
 Procedure Name: Intubation Date/Time: 04/04/2024 7:35 AM  Performed by: Pam Macario BROCKS, CRNAPre-anesthesia Checklist: Patient identified, Emergency Drugs available, Suction available, Patient being monitored and Timeout performed Patient Re-evaluated:Patient Re-evaluated prior to induction Oxygen Delivery Method: Circle system utilized Preoxygenation: Pre-oxygenation with 100% oxygen Induction Type: IV induction Ventilation: Mask ventilation without difficulty Laryngoscope Size: Mac and 4 Grade View: Grade II Tube type: Oral Tube size: 7.5 mm Number of attempts: 1 Airway Equipment and Method: Stylet and Oral airway Placement Confirmation: ETT inserted through vocal cords under direct vision, positive ETCO2, breath sounds checked- equal and bilateral and CO2 detector Secured at: 23 cm Tube secured with: Tape Dental Injury: Teeth and Oropharynx as per pre-operative assessment

## 2024-04-04 NOTE — Op Note (Signed)
 "  Patient: Francis Clayton ([birth year], [id number])  Date of Surgery: 04/04/2024  Preoperative Diagnosis: BILATERAL INGUINAL HERNIA   Postoperative Diagnosis: Bilateral Inguinal Hernia  Surgical Procedure: REPAIR, HERNIA, INGUINAL, LAPAROSCOPIC: 50349 (CPT)    Operative Team Members:  Surgeons and Role:    * Raelan Burgoon, Deward PARAS, MD - Primary   Doroteo Curly, MD - [facility] Resident Assistant  Anesthesiologist: Bonni Neuser Lamarr BRAVO, MD   Anesthesia: General   Fluids:  No intake/output data recorded.  Complications: None  Drains:  None  Specimen: None  Disposition:  PACU - hemodynamically stable.  Plan of Care: Discharge to home after PACU  Indications for Procedure: Francis Clayton is a 59 y.o. male who presented with bilateral inguinal hernias.  I recommended laparoscopic repair.  We discussed the procedure, its risks, benefits and alternatives and the patient granted consent to proceed.  Findings:  Technique: Transabdominal preperitoneal (TAPP) Hernia Location: Bilateral direct inguinal hernias Mesh Size &Type:  Bard 3D max extra-large right and left sided meshes Mesh Fixation: Endo-Universal hernia stapler  Infection status: Patient: Private Patient Elective Case Case: Elective Infection Present At Time Of Surgery (PATOS): None   Description of Procedure:  The patient was positioned supine, padded and secured to the bed, with both arms tucked.  The abdomen was widely prepped and draped.  A time out procedure was performed.  A 1 cm infraumbilical incision was made.  The abdomen was entered without trauma to the underlying viscera.  The abdomen was insufflated to 15 mm of Hg.  A 12 mm trocar was inserted at the periumbilical incision.  Additional 5 mm trocars were placed in the left and right abdomen.  There was no trauma to the underlying viscera.  There was an direct hernia on the RIGHT.  Utilizing a transabdominal pre peritoneal technique (TAPP), a horizontal incision was made  in the peritoneum, immediately below the umbilicus.  Dissection was carried out in the pre peritoneal space down to the level of the hernia sac which was reduced into the peritoneal cavity completely.  The cord contents were parietalized and preserved.  A large pre peritoneal dissection was performed to uncover the direct, indirect, femoral and obturator spaces.  Coopers ligament was uncovered medially and the psoas muscle uncovered laterally.  The mesh, as documented above, was opened and advanced into the pre peritoneal position so that it more than adequately covered the indirect, direct, femoral and obturator spaces.  The mesh laid flat, with no inferior folds and covered the entire myopectineal orifice.  The mesh was fixated with the endo-universal hernia stapler to Coopers ligament and the posterior aspect of the rectus muscle.  The peritoneal flap was closed with the same device.  There were no peritoneal defects or exposed mesh at the conclusion.  There was an direct hernia on the LEFT.  Utilizing a transabdominal pre peritoneal technique (TAPP), a horizontal incision was made in the peritoneum, immediately below the umbilicus.  Dissection was carried out in the pre peritoneal space down to the level of the hernia sac which was reduced into the peritoneal cavity completely.  The cord contents were parietalized and preserved.  A large pre peritoneal dissection was performed to uncover the direct, indirect, femoral and obturator spaces.  Coopers ligament was uncovered medially and the psoas muscle uncovered laterally.  The mesh, as documented above, was opened and advanced into the pre peritoneal position so that it more than adequately covered the indirect, direct, femoral and obturator spaces.  The mesh laid flat, with  no inferior folds and covered the entire myopectineal orifice.  The mesh was fixated with the endo-universal hernia stapler to Coopers ligament and the posterior aspect of the rectus  muscle.  The peritoneal flap was closed with the same device.  There were no peritoneal defects or exposed mesh at the conclusion.  The umbilical trocar was removed and the fascial defect was closed with a 0 Vicryl suture.  The peritoneal cavity was completely desufflated, the trocars removed and the skin closed with 4-0 Monocryl subcuticular suture and skin glue.  All sponge and needle counts were correct at the end of the case.  Deward Foy, MD General, Bariatric, & Minimally Invasive Surgery [facility], [place]  "

## 2024-04-04 NOTE — Transfer of Care (Signed)
 Immediate Anesthesia Transfer of Care Note  Patient: Francis Clayton  Procedure(s) Performed: REPAIR, HERNIA, INGUINAL, LAPAROSCOPIC (Bilateral)  Patient Location: PACU  Anesthesia Type:General  Level of Consciousness: awake and alert   Airway & Oxygen Therapy: Patient Spontanous Breathing and Patient connected to face mask oxygen  Post-op Assessment: Report given to RN and Post -op Vital signs reviewed and stable  Post vital signs: Reviewed and stable  Last Vitals:  Vitals Value Taken Time  BP 143/98 04/04/24 09:24  Temp 36.6 C 04/04/24 09:24  Pulse 73 04/04/24 09:29  Resp 15 04/04/24 09:29  SpO2 97 % 04/04/24 09:29  Vitals shown include unfiled device data.  Last Pain:  Vitals:   04/04/24 0542  TempSrc:   PainSc: 0-No pain         Complications: No notable events documented.

## 2024-04-04 NOTE — Discharge Instructions (Signed)

## 2024-04-04 NOTE — Anesthesia Postprocedure Evaluation (Signed)
"   Anesthesia Post Note  Patient: Francis Clayton  Procedure(s) Performed: REPAIR, HERNIA, INGUINAL, LAPAROSCOPIC (Bilateral)     Patient location during evaluation: PACU Anesthesia Type: General Level of consciousness: awake and alert Pain management: pain level controlled Vital Signs Assessment: post-procedure vital signs reviewed and stable Respiratory status: spontaneous breathing, nonlabored ventilation and respiratory function stable Cardiovascular status: blood pressure returned to baseline Postop Assessment: no apparent nausea or vomiting Anesthetic complications: no   No notable events documented.  Last Vitals:  Vitals:   04/04/24 0945 04/04/24 1000  BP: 126/82 123/82  Pulse: 73 79  Resp: 12 14  Temp:  36.6 C  SpO2: 98% (!) 88%    Last Pain:  Vitals:   04/04/24 1000  TempSrc:   PainSc: 3                  [name]      "

## 2024-04-04 NOTE — H&P (Signed)
 "  Admitting Physician: Deward PARAS Cuma Polyakov  Service: General surgery  CC: Bilateral inguinal hernia  Subjective   HPI: Francis Clayton is an 59 y.o. male who is here for bilateral inguinal hernia repair  Past Medical History:  Diagnosis Date   Acid reflux    Arthritis    Atrial flutter (HCC) 2025   Bilateral recurrent inguinal hernia 12/21/2023   Body mass index (BMI) 22.0-22.9, adult 04/03/2023   Body mass index [BMI] 22.0-22.9, adult     Cardiac arrhythmia 08/23/2023   Cellulitis of right ankle 09/27/2015   Cellulitis of right lower leg 09/29/2015   Chest pain 04/03/2023   Chest pain, unspecified     Closed fracture of one rib of left side 01/19/2017   COPD (chronic obstructive pulmonary disease) (HCC)    Cracked tooth 11/12/2019   Dyspnea 04/11/2023   Shortness of breathShortness of breath; Start Date : 04/03/2023     Electrocardiogram abnormal 04/03/2023   Abnormal electrocardiogram [ECG] [EKG]     Gastroesophageal reflux disease without esophagitis 08/23/2023   Gout    Hemoperitoneum 01/18/2017   Hernia, abdominal    Long term current use of anticoagulant therapy 08/27/2023   Mixed hyperlipidemia 08/27/2023   Pain of right heel 11/12/2019   Sepsis (HCC) 09/27/2015   Spleen laceration 01/18/2017   Tobacco abuse    Viral hepatitis A without hepatic coma 05/21/2019    Past Surgical History:  Procedure Laterality Date   CARDIOVERSION     x2   HERNIA REPAIR     when he was 43   I & D EXTREMITY Right 10/01/2015   Procedure: IRRIGATION AND DEBRIDEMENT EXTREMITY;  Surgeon: Jerona LULLA Sage, MD;  Location: [facility];  Service: Orthopedics;  Laterality: Right;   PRESSURE ULCER DEBRIDEMENT Right    to foot   SHOULDER SURGERY     TOE SURGERY     x3 toes    Family History  Problem Relation Age of Onset   Cancer Mother    Parkinson's disease Mother    Emphysema Father     Social:  reports that he has been smoking cigarettes. He has never used smokeless tobacco. He  reports that he does not drink alcohol and does not use drugs.  Allergies: Allergies[1]  Medications: Current Outpatient Medications  Medication Instructions   amiodarone (PACERONE) 200 mg, Daily   atorvastatin  (LIPITOR) 20 mg, Oral, Daily at bedtime   Budeson-Glycopyrrol-Formoterol  (BREZTRI  AEROSPHERE IN) 1 puff, Daily PRN   buPROPion (WELLBUTRIN XL) 150 mg, Daily   cetirizine (ZYRTEC) 10 mg, Oral, Daily   Eliquis 5 mg, Oral, 2 times daily   methylPREDNISolone  (MEDROL  DOSEPAK) 4 MG TBPK tablet 6 Day Tapering Dose   neomycin-bacitracin-polymyxin (NEOSPORIN) OINT 1 Application, Daily PRN   nicotine  (NICODERM CQ  - DOSED IN MG/24 HOURS) 14 mg, Daily   nitroGLYCERIN  (NITROSTAT ) 0.4 mg, Every 5 min PRN   pantoprazole  (PROTONIX ) 40 mg, Oral, Daily   Pseudoeph-Doxylamine-DM-APAP (NYQUIL PO) 1 tablet, Daily   SYMBICORT  160-4.5 MCG/ACT inhaler 2 puffs, 2 times daily   traZODone  (DESYREL ) 50 mg, Oral, Daily at bedtime    ROS - all of the below systems have been reviewed with the patient and positives are indicated with bold text General: chills, fever or night sweats Eyes: blurry vision or double vision ENT: epistaxis or sore throat Allergy/Immunology: itchy/watery eyes or nasal congestion Hematologic/Lymphatic: bleeding problems, blood clots or swollen lymph nodes Endocrine: temperature intolerance or unexpected weight changes Breast: new or changing breast lumps or nipple  discharge Resp: cough, shortness of breath, or wheezing CV: chest pain or dyspnea on exertion GI: as per HPI GU: dysuria, trouble voiding, or hematuria MSK: joint pain or joint stiffness Neuro: TIA or stroke symptoms Derm: pruritus and skin lesion changes Psych: anxiety and depression  Objective   PE Blood pressure (!) 133/94, pulse 85, temperature 97.7 F (36.5 C), temperature source Oral, resp. rate 17, height 6' 4 (1.93 m), weight 87.5 kg, SpO2 97%. Constitutional: NAD; conversant; no deformities Eyes:  Moist conjunctiva; no lid lag; anicteric; PERRL Neck: Trachea midline; no thyromegaly Lungs: Normal respiratory effort; no tactile fremitus CV: RRR; no palpable thrills; no pitting edema GI: Abd soft, nontender, bilateral inguinal hernias; no palpable hepatosplenomegaly MSK: Normal range of motion of extremities; no clubbing/cyanosis Psychiatric: Appropriate affect; alert and oriented x3 Lymphatic: No palpable cervical or axillary lymphadenopathy  No results found for this or any previous visit (from the past 24 hours).  Imaging Orders  No imaging studies ordered today     Assessment and Plan   Francis Clayton is an 59 y.o. male with bilateral inguinal hernias.  I recommended laparoscopic bilateral inguinal hernia repair with mesh.  We discussed the  procedure, its risks, benefits and alternatives and the patient granted consent to proceed.   Deward JINNY Foy, MD  [facility] Use [email or web address] to contact on call provider       [1] No Known Allergies  "

## 2024-04-04 NOTE — Progress Notes (Signed)
 While assess pt, said no one can stay with him at home today. Called to His friend, Mr. Izetta, told he can not stay with home. Notified Dr. Otilia, will keep pt for tonight.

## 2024-04-04 NOTE — Addendum Note (Signed)
 Addendum  created 04/04/24 1024 by Pam Macario BROCKS, CRNA   Intraprocedure Meds edited

## 2024-04-05 ENCOUNTER — Other Ambulatory Visit (HOSPITAL_COMMUNITY): Payer: Self-pay

## 2024-04-05 DIAGNOSIS — K4020 Bilateral inguinal hernia, without obstruction or gangrene, not specified as recurrent: Secondary | ICD-10-CM | POA: Diagnosis not present

## 2024-04-05 MED ORDER — DOCUSATE SODIUM 100 MG PO CAPS
100.0000 mg | ORAL_CAPSULE | Freq: Two times a day (BID) | ORAL | 0 refills | Status: AC
  Filled 2024-04-05: qty 10, 5d supply, fill #0

## 2024-04-05 MED ORDER — METHOCARBAMOL 500 MG PO TABS
500.0000 mg | ORAL_TABLET | Freq: Three times a day (TID) | ORAL | 0 refills | Status: AC | PRN
  Filled 2024-04-05: qty 40, 14d supply, fill #0

## 2024-04-05 MED ORDER — GABAPENTIN 300 MG PO CAPS
300.0000 mg | ORAL_CAPSULE | Freq: Three times a day (TID) | ORAL | 0 refills | Status: AC
  Filled 2024-04-05: qty 90, 30d supply, fill #0

## 2024-04-05 MED ORDER — SIMETHICONE 180 MG PO CAPS
180.0000 mg | ORAL_CAPSULE | Freq: Four times a day (QID) | ORAL | 0 refills | Status: AC | PRN
  Filled 2024-04-05: qty 30, 8d supply, fill #0

## 2024-04-05 MED ORDER — OXYCODONE HCL 10 MG PO TABS
10.0000 mg | ORAL_TABLET | ORAL | 0 refills | Status: AC | PRN
  Filled 2024-04-05: qty 30, 7d supply, fill #0

## 2024-04-05 NOTE — Discharge Summary (Signed)
 Physician Discharge Summary  Patient ID: Francis Clayton MRN: [id number] DOB/AGE: 12/22/1965 59 y.o.  Admit date: 04/04/2024 Discharge date: 04/05/2024  Admission Diagnoses: Bilateral inguinal hernia COPD Tobacco abuse GERD Atrial flutter Anticoagulant use  Discharge Diagnoses:  Principal Problem:   Bilateral inguinal hernia And same as above  Discharged Condition: stable  Hospital Course:  Patient was admitted to the floor following laparoscopic bilateral inguinal hernia repair on 04/04/2024 by Dr. Lyndel.  He did have pain overnight including right shoulder pain.  He has a history of bursitis and felt like this was complicating factors.  He denied nausea and vomiting.  He is able to void and ambulate spontaneously.  He had some gas pain which was assisted by simethicone .  He was tolerating some regular food prior to discharge.  His discharge to home after lunch in stable condition.  Consults: None  Significant Diagnostic Studies: labs: post op HCT 41.3  Treatments: surgery: see above  Discharge Exam: Blood pressure 108/78, pulse 74, temperature 97.8 F (36.6 C), temperature source Oral, resp. rate 17, height 6' 4 (1.93 m), weight 87.5 kg, SpO2 96%. General appearance: alert, cooperative, and mild distress Resp: breathing comfortably GI: soft, mildly distended, some faint bruising periumbilical incision, approp tender at incision  Disposition: Discharge disposition: 01-Home or Self Care       Discharge Instructions     Call MD for:  difficulty breathing, headache or visual disturbances   Complete by: As directed    Call MD for:  hives   Complete by: As directed    Call MD for:  persistant nausea and vomiting   Complete by: As directed    Call MD for:  redness, tenderness, or signs of infection (pain, swelling, redness, odor or green/yellow discharge around incision site)   Complete by: As directed    Call MD for:  severe uncontrolled pain   Complete by: As  directed    Call MD for:  temperature >100.4   Complete by: As directed    Diet - low sodium heart healthy   Complete by: As directed    Increase activity slowly   Complete by: As directed    Increase activity slowly   Complete by: As directed    Increase activity slowly   Complete by: As directed       Allergies as of 04/05/2024   No Known Allergies      Medication List     TAKE these medications    amiodarone 200 MG tablet Commonly known as: PACERONE Take 200 mg by mouth daily.   atorvastatin  20 MG tablet Commonly known as: LIPITOR Take 1 tablet (20 mg total) by mouth at bedtime. What changed:  how much to take when to take this   BREZTRI  AEROSPHERE IN Inhale 1 puff into the lungs daily as needed.   buPROPion 150 MG 24 hr tablet Commonly known as: WELLBUTRIN XL Take 150 mg by mouth daily.   cetirizine 10 MG tablet Commonly known as: ZYRTEC Take 10 mg by mouth daily.   docusate sodium  100 MG capsule Commonly known as: COLACE Take 1 capsule (100 mg total) by mouth 2 (two) times daily.   Eliquis 5 MG Tabs tablet Generic drug: apixaban Take 5 mg by mouth 2 (two) times daily.   gabapentin  300 MG capsule Commonly known as: NEURONTIN  Take 1 capsule (300 mg total) by mouth 3 (three) times daily.   methocarbamol  500 MG tablet Commonly known as: ROBAXIN  Take 1 tablet (500 mg total) by  mouth every 8 (eight) hours as needed (use for muscle cramps/pain).   methylPREDNISolone  4 MG Tbpk tablet Commonly known as: MEDROL  DOSEPAK 6 Day Tapering Dose   neomycin-bacitracin-polymyxin Oint Commonly known as: NEOSPORIN Apply 1 Application topically daily as needed for wound care.   nicotine  14 mg/24hr patch Commonly known as: NICODERM CQ  - dosed in mg/24 hours Place 14 mg onto the skin daily.   nitroGLYCERIN  0.4 MG SL tablet Commonly known as: NITROSTAT  Place 0.4 mg under the tongue every 5 (five) minutes as needed for chest pain.   NYQUIL PO Take 1 tablet by  mouth daily.   oxyCODONE  5 MG immediate release tablet Commonly known as: Roxicodone  Take 1 tablet (5 mg total) by mouth every 8 (eight) hours as needed.   Oxycodone  HCl 10 MG Tabs Take 1 tablet (10 mg total) by mouth every 4 (four) hours as needed for severe pain (pain score 7-10).   pantoprazole  40 MG tablet Commonly known as: PROTONIX  Take 1 tablet (40 mg total) by mouth daily.   simethicone  80 MG chewable tablet Commonly known as: MYLICON Chew 1 tablet (80 mg total) by mouth 4 (four) times daily as needed for flatulence.   Symbicort  160-4.5 MCG/ACT inhaler Generic drug: budesonide -formoterol  Inhale 2 puffs into the lungs 2 (two) times daily.   traZODone  50 MG tablet Commonly known as: DESYREL  Take 50 mg by mouth at bedtime.        Follow-up Information     Stechschulte, Deward PARAS, MD Follow up in 4 week(s).   Specialty: Surgery Contact information: [address] [place] [address] [phone]                 Signed: Jina Nephew 04/05/2024, 10:18 AM

## 2024-04-05 NOTE — Progress Notes (Signed)
[facility]

## 2024-04-05 NOTE — Progress Notes (Signed)
 Discharge instructions discussed with patient, verbalized agreement and understanding.   Patient to be picked up by [facility], trip number [phone]

## 2024-04-05 NOTE — Plan of Care (Signed)
" °  Problem: Skin Integrity: Goal: Demonstrates signs of wound healing without infection Outcome: Progressing   Problem: Urinary Elimination: Goal: Will remain free from infection Outcome: Progressing   "

## 2024-04-05 NOTE — TOC Initial Note (Signed)
 Transition of Care [facility]) - Initial/Assessment Note    Patient Details  Name: Francis Clayton MRN: [id number] Date of Birth: November 08, 1965  Transition of Care [facility]) CM/SW Contact:    Sonda Manuella Quill, RN Phone Number: 04/05/2024, 2:39 PM  Clinical Narrative:                 Spoke w/ pt in room; pt said he lives at home; he plans to return w/support from family/friends at d/c; pt will arrange his own transportation; he identified POV Lynwood Brier (friend) [phone]; pt said he has difficulty paying for food and transportation; he denied other SDOH risks; insurance/PCP verified; his Medicaid Navigator is Vernell, and Case Worker is Lawrnce Chard; he does not have DME, HH services, or home oxygen; he agreed to receive resources for social services, financial assistance, and transportation; pt verbalized understanding he will make his own appt w/ agencies of choice; he was also encouraged to discuss these risks w/ his Medicaid support staff; resource info placed in d/c instructions; no IP CM needs.  Expected Discharge Plan: Home/Self Care Barriers to Discharge: No Barriers Identified   Patient Goals and CMS Choice            Expected Discharge Plan and Services   Discharge Planning Services: CM Consult   Living arrangements for the past 2 months: Apartment Expected Discharge Date: 04/05/24               DME Arranged: N/A DME Agency: NA       HH Arranged: NA HH Agency: NA        Prior Living Arrangements/Services Living arrangements for the past 2 months: Apartment Lives with:: Self Patient language and need for interpreter reviewed:: Yes Do you feel safe going back to the place where you live?: Yes      Need for Family Participation in Patient Care: Yes (Comment) Care giver support system in place?: Yes (comment) Current home services:  (n/a) Criminal Activity/Legal Involvement Pertinent to Current Situation/Hospitalization: No - Comment as needed  Activities of  Daily Living   ADL Screening (condition at time of admission) Independently performs ADLs?: Yes (appropriate for developmental age) Is the patient deaf or have difficulty hearing?: No Does the patient have difficulty seeing, even when wearing glasses/contacts?: No Does the patient have difficulty concentrating, remembering, or making decisions?: No  Permission Sought/Granted Permission sought to share information with : Case Manager Permission granted to share information with : Yes, Verbal Permission Granted  Share Information with NAME: Case Manager     Permission granted to share info w Relationship: Lynwood Brier (friend) [phone]     Emotional Assessment Appearance:: Appears stated age Attitude/Demeanor/Rapport: Gracious Affect (typically observed): Accepting Orientation: : Oriented to Self, Oriented to Place, Oriented to  Time, Oriented to Situation Alcohol / Substance Use: Not Applicable Psych Involvement: No (comment)  Admission diagnosis:  Bilateral inguinal hernia [K40.20] Patient Active Problem List   Diagnosis Date Noted   Bilateral inguinal hernia 04/04/2024   Bilateral recurrent inguinal hernia 12/21/2023   Long term current use of anticoagulant therapy 08/27/2023   Mixed hyperlipidemia 08/27/2023   Cardiac arrhythmia 08/23/2023   Gastroesophageal reflux disease without esophagitis 08/23/2023   Dyspnea 04/11/2023   Tobacco abuse counseling 04/11/2023   Acid reflux    Arthritis    Gout    Hernia, abdominal    Body mass index (BMI) 22.0-22.9, adult 04/03/2023   Chest pain 04/03/2023   Electrocardiogram abnormal 04/03/2023   Atrial flutter (HCC) 2025  Encounter to establish care 11/12/2019   History of COPD 11/12/2019   History of gastroesophageal reflux (GERD) 11/12/2019   Pain of right heel 11/12/2019   Cracked tooth 11/12/2019   Viral hepatitis A without hepatic coma 05/21/2019   Hemoperitoneum 01/18/2017   Spleen laceration 01/18/2017    Cellulitis of right lower leg 09/29/2015   Sepsis (HCC) 09/27/2015   Cellulitis of right ankle 09/27/2015   COPD (chronic obstructive pulmonary disease) (HCC)    Tobacco abuse    PCP:  Duwaine Burnard Amble, NP Pharmacy:   [facility] [address],  - [address] [facility] [place] [address] Phone: [phone] Fax: [phone]  [facility] [address] [place] [address] Phone: [phone] Fax: [phone]     Social Drivers of Health (SDOH) Social History: SDOH Screenings   Food Insecurity: Food Insecurity Present (04/05/2024)  Housing: Low Risk (04/05/2024)  Transportation Needs: Unmet Transportation Needs (04/05/2024)  Utilities: Not At Risk (04/05/2024)  Tobacco Use: High Risk (04/04/2024)   SDOH Interventions: Food Insecurity Interventions: [employer] Provided, Inpatient TOC Housing Interventions: Inpatient TOC, Intervention Not Indicated Transportation Interventions: [employer] Provided, Inpatient TOC Utilities Interventions: Intervention Not Indicated, Inpatient TOC   Readmission Risk Interventions     No data to display

## 2024-04-07 ENCOUNTER — Encounter (HOSPITAL_COMMUNITY): Payer: Self-pay | Admitting: Surgery

## 2024-04-07 ENCOUNTER — Other Ambulatory Visit: Payer: Self-pay
